# Patient Record
Sex: Female | Born: 1950
Health system: Southern US, Community
[De-identification: ages and names within clinical notes are randomized; demographics above are authoritative.]

## PROBLEM LIST (undated history)

## (undated) DIAGNOSIS — C50919 Malignant neoplasm of unspecified site of unspecified female breast: Secondary | ICD-10-CM

## (undated) DIAGNOSIS — E039 Hypothyroidism, unspecified: Secondary | ICD-10-CM

## (undated) DIAGNOSIS — T8859XA Other complications of anesthesia, initial encounter: Secondary | ICD-10-CM

## (undated) DIAGNOSIS — I7300 Raynaud's syndrome without gangrene: Secondary | ICD-10-CM

## (undated) DIAGNOSIS — L409 Psoriasis, unspecified: Secondary | ICD-10-CM

## (undated) DIAGNOSIS — F419 Anxiety disorder, unspecified: Secondary | ICD-10-CM

## (undated) DIAGNOSIS — R112 Nausea with vomiting, unspecified: Secondary | ICD-10-CM

## (undated) DIAGNOSIS — Z923 Personal history of irradiation: Secondary | ICD-10-CM

## (undated) DIAGNOSIS — Z9889 Other specified postprocedural states: Secondary | ICD-10-CM

## (undated) DIAGNOSIS — M1990 Unspecified osteoarthritis, unspecified site: Secondary | ICD-10-CM

## (undated) DIAGNOSIS — N882 Stricture and stenosis of cervix uteri: Secondary | ICD-10-CM

## (undated) DIAGNOSIS — K219 Gastro-esophageal reflux disease without esophagitis: Secondary | ICD-10-CM

## (undated) DIAGNOSIS — R9389 Abnormal findings on diagnostic imaging of other specified body structures: Secondary | ICD-10-CM

## (undated) HISTORY — DX: Raynaud's syndrome without gangrene: I73.00

## (undated) HISTORY — PX: SHOULDER ACROMIOPLASTY: SHX6093

## (undated) HISTORY — DX: Gastro-esophageal reflux disease without esophagitis: K21.9

## (undated) HISTORY — DX: Malignant neoplasm of unspecified site of unspecified female breast: C50.919

## (undated) HISTORY — DX: Anxiety disorder, unspecified: F41.9

## (undated) HISTORY — PX: SHOULDER ARTHROSCOPY: SHX128

## (undated) HISTORY — PX: TONSILLECTOMY: SUR1361

## (undated) HISTORY — PX: CATARACT EXTRACTION: SUR2

## (undated) HISTORY — DX: Unspecified osteoarthritis, unspecified site: M19.90

## (undated) HISTORY — DX: Psoriasis, unspecified: L40.9

---

## 1998-04-25 DIAGNOSIS — C4490 Unspecified malignant neoplasm of skin, unspecified: Secondary | ICD-10-CM

## 1998-04-25 DIAGNOSIS — Z85828 Personal history of other malignant neoplasm of skin: Secondary | ICD-10-CM

## 1998-04-25 HISTORY — DX: Personal history of other malignant neoplasm of skin: Z85.828

## 1998-04-25 HISTORY — DX: Unspecified malignant neoplasm of skin, unspecified: C44.90

## 2001-07-04 ENCOUNTER — Other Ambulatory Visit: Admission: RE | Admit: 2001-07-04 | Discharge: 2001-07-04 | Payer: Self-pay | Admitting: Family Medicine

## 2002-09-10 ENCOUNTER — Other Ambulatory Visit: Admission: RE | Admit: 2002-09-10 | Discharge: 2002-09-10 | Payer: Self-pay | Admitting: Family Medicine

## 2003-06-05 ENCOUNTER — Encounter: Admission: RE | Admit: 2003-06-05 | Discharge: 2003-06-05 | Payer: Self-pay | Admitting: Family Medicine

## 2005-10-31 ENCOUNTER — Encounter: Admission: RE | Admit: 2005-10-31 | Discharge: 2005-10-31 | Payer: Self-pay | Admitting: Family Medicine

## 2005-12-24 HISTORY — PX: COLONOSCOPY: SHX174

## 2005-12-30 LAB — HM COLONOSCOPY

## 2007-09-11 ENCOUNTER — Encounter: Admission: RE | Admit: 2007-09-11 | Discharge: 2007-09-11 | Payer: Self-pay | Admitting: Family Medicine

## 2012-03-21 ENCOUNTER — Other Ambulatory Visit: Payer: Self-pay | Admitting: Family Medicine

## 2012-03-21 DIAGNOSIS — Z1231 Encounter for screening mammogram for malignant neoplasm of breast: Secondary | ICD-10-CM

## 2012-05-04 ENCOUNTER — Ambulatory Visit
Admission: RE | Admit: 2012-05-04 | Discharge: 2012-05-04 | Disposition: A | Discharge status: Home / Self Care | Payer: BC Managed Care – PPO | Source: Ambulatory Visit | Attending: Family Medicine | Admitting: Family Medicine

## 2012-05-04 DIAGNOSIS — Z1231 Encounter for screening mammogram for malignant neoplasm of breast: Secondary | ICD-10-CM

## 2014-01-29 ENCOUNTER — Other Ambulatory Visit: Payer: Self-pay

## 2014-01-29 DIAGNOSIS — Z1239 Encounter for other screening for malignant neoplasm of breast: Secondary | ICD-10-CM

## 2014-02-24 ENCOUNTER — Other Ambulatory Visit: Payer: Self-pay

## 2014-02-24 ENCOUNTER — Ambulatory Visit
Admission: RE | Admit: 2014-02-24 | Discharge: 2014-02-24 | Disposition: A | Discharge status: Home / Self Care | Payer: BC Managed Care – PPO | Source: Ambulatory Visit

## 2014-02-24 ENCOUNTER — Encounter (INDEPENDENT_AMBULATORY_CARE_PROVIDER_SITE_OTHER): Payer: Self-pay

## 2014-02-24 DIAGNOSIS — Z1231 Encounter for screening mammogram for malignant neoplasm of breast: Secondary | ICD-10-CM

## 2014-02-25 ENCOUNTER — Other Ambulatory Visit: Payer: Self-pay | Admitting: Family Medicine

## 2014-02-25 DIAGNOSIS — R1011 Right upper quadrant pain: Secondary | ICD-10-CM

## 2014-03-03 ENCOUNTER — Ambulatory Visit
Admission: RE | Admit: 2014-03-03 | Discharge: 2014-03-03 | Disposition: A | Discharge status: Home / Self Care | Payer: BC Managed Care – PPO | Source: Ambulatory Visit | Attending: Family Medicine | Admitting: Family Medicine

## 2014-03-03 ENCOUNTER — Other Ambulatory Visit: Payer: Self-pay | Admitting: Family Medicine

## 2014-03-03 DIAGNOSIS — R1011 Right upper quadrant pain: Secondary | ICD-10-CM

## 2016-01-07 LAB — HM COLONOSCOPY

## 2017-04-25 HISTORY — PX: CATARACT EXTRACTION W/ INTRAOCULAR LENS IMPLANT: SHX1309

## 2018-02-02 ENCOUNTER — Other Ambulatory Visit: Payer: Self-pay | Admitting: Nurse Practitioner

## 2018-02-02 DIAGNOSIS — Z1231 Encounter for screening mammogram for malignant neoplasm of breast: Secondary | ICD-10-CM

## 2018-02-23 HISTORY — PX: EYE SURGERY: SHX253

## 2018-03-27 ENCOUNTER — Ambulatory Visit
Admission: RE | Admit: 2018-03-27 | Discharge: 2018-03-27 | Disposition: A | Discharge status: Home / Self Care | Payer: Medicare Other | Source: Ambulatory Visit | Attending: Nurse Practitioner | Admitting: Nurse Practitioner

## 2018-03-27 DIAGNOSIS — Z1231 Encounter for screening mammogram for malignant neoplasm of breast: Secondary | ICD-10-CM

## 2019-10-21 DIAGNOSIS — Z1331 Encounter for screening for depression: Secondary | ICD-10-CM | POA: Diagnosis not present

## 2019-10-21 DIAGNOSIS — Z0000 Encounter for general adult medical examination without abnormal findings: Secondary | ICD-10-CM | POA: Diagnosis not present

## 2019-10-21 DIAGNOSIS — Z9181 History of falling: Secondary | ICD-10-CM | POA: Diagnosis not present

## 2020-02-24 DIAGNOSIS — L409 Psoriasis, unspecified: Secondary | ICD-10-CM | POA: Diagnosis not present

## 2020-02-24 DIAGNOSIS — Z6822 Body mass index (BMI) 22.0-22.9, adult: Secondary | ICD-10-CM | POA: Diagnosis not present

## 2020-02-24 DIAGNOSIS — R300 Dysuria: Secondary | ICD-10-CM | POA: Diagnosis not present

## 2020-02-24 DIAGNOSIS — N390 Urinary tract infection, site not specified: Secondary | ICD-10-CM | POA: Diagnosis not present

## 2020-03-11 ENCOUNTER — Ambulatory Visit: Payer: Medicare PPO | Admitting: Family Medicine

## 2020-03-11 ENCOUNTER — Other Ambulatory Visit: Payer: Self-pay

## 2020-03-11 ENCOUNTER — Encounter: Payer: Self-pay | Admitting: Family Medicine

## 2020-03-11 VITALS — BP 120/69 | HR 73 | Ht 64.0 in | Wt 130.4 lb

## 2020-03-11 DIAGNOSIS — M79671 Pain in right foot: Secondary | ICD-10-CM | POA: Diagnosis not present

## 2020-03-11 DIAGNOSIS — M79672 Pain in left foot: Secondary | ICD-10-CM

## 2020-03-11 DIAGNOSIS — E785 Hyperlipidemia, unspecified: Secondary | ICD-10-CM | POA: Diagnosis not present

## 2020-03-11 DIAGNOSIS — Z0000 Encounter for general adult medical examination without abnormal findings: Secondary | ICD-10-CM | POA: Diagnosis not present

## 2020-03-11 NOTE — Progress Notes (Addendum)
Office Visit Note   Patient: Sheena White           Date of Birth: 03/31/1951           MRN: [id number] Visit Date: 03/11/2020 Requested by: Philmore Pali, NP [place],  [place] [address] PCP: Philmore Pali, NP  Subjective: Chief Complaint  Patient presents with  . establish primary care    HPI: She is here to establish care.  She is due for a wellness exam with labs.  Her daughter is a patient of our clinic.  This past March she received the Covid vaccine.  Since then she has not felt her normal self.  She has a history of psoriasis which resolved completely in 2014 when she eliminated gluten from her diet.  She was completely asymptomatic until this past year and now her rash is almost as bad as it was before.  Her previous provider gave her a refill of topical steroids and that seems to be helping.  In addition, recently her feet have bothered her without injury.  The left one started hurting first, then the right.  Both have been hurting near the MTP joints of the second and third toes.  She is still ambulatory.  She has a history of hyperlipidemia in the past not requiring treatment.  She has a history of GERD which is currently under good control.                ROS:   All other systems were reviewed and are negative.  Objective: Vital Signs: BP 120/69   Pulse 73   Ht 5\' 4"  (1.626 m)   Wt 130 lb 6.4 oz (59.1 kg)   BMI 22.38 kg/m   Physical Exam:  General:  Alert and oriented, in no acute distress. Pulm:  Breathing unlabored. Psy:  Normal mood, congruent affect. Skin: She has psoriasis on the back of her scalp. HEENT:  Nasal passages are clear.  No significant lymphadenopathy.  No thyromegaly or nodules.  2+ carotid pulses without bruits. CV: Regular rate and rhythm without murmurs, rubs, or gallops.  No peripheral edema.  2+ radial and posterior tibial pulses. Lungs: Clear to auscultation throughout with no wheezing or areas of consolidation. Abd: Bowel  sounds are active, no hepatosplenomegaly or masses.  Soft and nontender.  No audible bruits.  No evidence of ascites. Feet: There is slight swelling on the dorsum of both feet near the second and third MTP joints.  She has tenderness to palpation dorsally but not from the plantar approach.  No pain with extension of the toes against resistance.   Imaging: No results found.  Assessment & Plan: 1.  Wellness exam -Labs to evaluate.  2.  Psoriasis -Trial of elimination diet.  3.  Bilateral foot pain, possible metatarsalgia versus metatarsal stress fractures. -Trial of Voltaren gel.  X-rays if symptoms persist.     Procedures: No procedures performed  No notes on file     PMFS History: There are no problems to display for this patient.  History reviewed. No pertinent past medical history.  History reviewed. No pertinent family history.  History reviewed. No pertinent surgical history. Social History   Occupational History  . Not on file  Tobacco Use  . Smoking status: Not on file  Substance and Sexual Activity  . Alcohol use: Not on file  . Drug use: Not on file  . Sexual activity: Not on file

## 2020-03-12 ENCOUNTER — Telehealth: Payer: Self-pay | Admitting: Family Medicine

## 2020-03-12 DIAGNOSIS — E785 Hyperlipidemia, unspecified: Secondary | ICD-10-CM

## 2020-03-12 LAB — COMPREHENSIVE METABOLIC PANEL
AG Ratio: 2 (calc) (ref 1.0–2.5)
ALT: 20 U/L (ref 6–29)
AST: 25 U/L (ref 10–35)
Albumin: 4.7 g/dL (ref 3.6–5.1)
Alkaline phosphatase (APISO): 71 U/L (ref 37–153)
BUN: 13 mg/dL (ref 7–25)
CO2: 26 mmol/L (ref 20–32)
Calcium: 10.1 mg/dL (ref 8.6–10.4)
Chloride: 98 mmol/L (ref 98–110)
Creat: 0.68 mg/dL (ref 0.50–0.99)
Globulin: 2.4 g/dL (calc) (ref 1.9–3.7)
Glucose, Bld: 90 mg/dL (ref 65–99)
Potassium: 4 mmol/L (ref 3.5–5.3)
Sodium: 137 mmol/L (ref 135–146)
Total Bilirubin: 0.4 mg/dL (ref 0.2–1.2)
Total Protein: 7.1 g/dL (ref 6.1–8.1)

## 2020-03-12 LAB — CBC WITH DIFFERENTIAL/PLATELET
Absolute Monocytes: 518 cells/uL (ref 200–950)
Basophils Absolute: 73 cells/uL (ref 0–200)
Basophils Relative: 0.9 %
Eosinophils Absolute: 113 cells/uL (ref 15–500)
Eosinophils Relative: 1.4 %
HCT: 40.8 % (ref 35.0–45.0)
Hemoglobin: 13.3 g/dL (ref 11.7–15.5)
Lymphs Abs: 3272 cells/uL (ref 850–3900)
MCH: 30.8 pg (ref 27.0–33.0)
MCHC: 32.6 g/dL (ref 32.0–36.0)
MCV: 94.4 fL (ref 80.0–100.0)
MPV: 10.1 fL (ref 7.5–12.5)
Monocytes Relative: 6.4 %
Neutro Abs: 4123 cells/uL (ref 1500–7800)
Neutrophils Relative %: 50.9 %
Platelets: 328 10*3/uL (ref 140–400)
RBC: 4.32 10*6/uL (ref 3.80–5.10)
RDW: 13.1 % (ref 11.0–15.0)
Total Lymphocyte: 40.4 %
WBC: 8.1 10*3/uL (ref 3.8–10.8)

## 2020-03-12 LAB — THYROID PANEL WITH TSH
Free Thyroxine Index: 2.5 (ref 1.4–3.8)
T3 Uptake: 28 % (ref 22–35)
T4, Total: 9.1 ug/dL (ref 5.1–11.9)
TSH: 3.4 mIU/L (ref 0.40–4.50)

## 2020-03-12 LAB — LIPID PANEL
Cholesterol: 258 mg/dL — ABNORMAL HIGH (ref <200)
HDL: 82 mg/dL (ref >=50)
LDL Cholesterol (Calc): 160 mg/dL (calc) — ABNORMAL HIGH
Non-HDL Cholesterol (Calc): 176 mg/dL (calc) — ABNORMAL HIGH (ref <130)
Total CHOL/HDL Ratio: 3.1 (calc) (ref <5.0)
Triglycerides: 68 mg/dL (ref <150)

## 2020-03-12 LAB — HIGH SENSITIVITY CRP: hs-CRP: 1.5 mg/L

## 2020-03-12 NOTE — Telephone Encounter (Signed)
Labs are notable for the following:  Thyroid studies are in normal range but TSH is lightly higher than ideal at 3.4.  A better range would be 0.5-1.0.  Total and LDL cholesterol are elevated, but HDL and triglycerides look great.  Regular exercise and dietary limitation of processed carbohydrates and sweets remains most important for cardiac prevention.  Could contemplate ordering a CT calcium score to better assess cardiac risk.  Would recheck in 6 to 12 months.  All else looks good.

## 2020-03-13 ENCOUNTER — Encounter: Payer: Self-pay | Admitting: Family Medicine

## 2020-03-13 DIAGNOSIS — M79671 Pain in right foot: Secondary | ICD-10-CM

## 2020-03-17 ENCOUNTER — Ambulatory Visit (INDEPENDENT_AMBULATORY_CARE_PROVIDER_SITE_OTHER): Payer: Medicare PPO

## 2020-03-17 ENCOUNTER — Telehealth: Payer: Self-pay

## 2020-03-17 ENCOUNTER — Telehealth: Payer: Self-pay | Admitting: Family Medicine

## 2020-03-17 ENCOUNTER — Other Ambulatory Visit: Payer: Self-pay

## 2020-03-17 ENCOUNTER — Ambulatory Visit: Payer: Medicare PPO

## 2020-03-17 DIAGNOSIS — M79672 Pain in left foot: Secondary | ICD-10-CM

## 2020-03-17 DIAGNOSIS — M79671 Pain in right foot: Secondary | ICD-10-CM

## 2020-03-17 NOTE — Telephone Encounter (Signed)
Three-view x-rays of the left foot reveal osteoarthritis at the first and second MTP joints.  No acute abnormality seen, no sign of stress fracture.  X-rays of the right foot reveal a distal fourth metatarsal stress fracture with slight angulation.  No other abnormality seen.

## 2020-03-17 NOTE — Telephone Encounter (Signed)
The patient came in today for bilateral foot xrays. Please review these and advise the patient on the findings.

## 2020-03-17 NOTE — Telephone Encounter (Signed)
Sent a new patient message to set up a "nurse only" appointment.

## 2020-03-17 NOTE — Addendum Note (Signed)
Addended by: Hortencia Pilar on: 03/17/2020 07:59 AM   Modules accepted: Orders

## 2020-03-17 NOTE — Progress Notes (Signed)
The patient came in today for bilateral foot xrays per Dr. Junius Roads. He will review them and message the patient with results.

## 2020-03-17 NOTE — Telephone Encounter (Signed)
Done. MyChart message sent.

## 2020-03-18 ENCOUNTER — Ambulatory Visit (INDEPENDENT_AMBULATORY_CARE_PROVIDER_SITE_OTHER): Payer: Medicare PPO

## 2020-03-18 DIAGNOSIS — M84374A Stress fracture, right foot, initial encounter for fracture: Secondary | ICD-10-CM

## 2020-03-18 NOTE — Progress Notes (Signed)
Patient came in to be fitted for a post op shoe vs cam boot, for a right foot stress fracture, per Dr. Junius Roads. A small cam boot gave her the most support. Appointment made for office visit with Dr. Junius Roads on 04/08/2020 at 9:20 - repeat foot xrays at that time.

## 2020-03-23 ENCOUNTER — Other Ambulatory Visit: Payer: Self-pay | Admitting: Nurse Practitioner

## 2020-03-23 DIAGNOSIS — Z1231 Encounter for screening mammogram for malignant neoplasm of breast: Secondary | ICD-10-CM

## 2020-03-27 ENCOUNTER — Ambulatory Visit
Admission: RE | Admit: 2020-03-27 | Discharge: 2020-03-27 | Disposition: A | Discharge status: Home / Self Care | Payer: Medicare PPO | Source: Ambulatory Visit

## 2020-03-27 ENCOUNTER — Other Ambulatory Visit: Payer: Self-pay

## 2020-03-27 DIAGNOSIS — Z1231 Encounter for screening mammogram for malignant neoplasm of breast: Secondary | ICD-10-CM

## 2020-03-31 ENCOUNTER — Telehealth: Payer: Self-pay | Admitting: Family Medicine

## 2020-03-31 DIAGNOSIS — N63 Unspecified lump in unspecified breast: Secondary | ICD-10-CM

## 2020-03-31 NOTE — Telephone Encounter (Signed)
Mammogram incomplete.

## 2020-04-01 ENCOUNTER — Other Ambulatory Visit: Payer: Self-pay | Admitting: Nurse Practitioner

## 2020-04-01 DIAGNOSIS — R928 Other abnormal and inconclusive findings on diagnostic imaging of breast: Secondary | ICD-10-CM

## 2020-04-01 DIAGNOSIS — H26493 Other secondary cataract, bilateral: Secondary | ICD-10-CM | POA: Diagnosis not present

## 2020-04-03 ENCOUNTER — Ambulatory Visit
Admission: RE | Admit: 2020-04-03 | Discharge: 2020-04-03 | Disposition: A | Discharge status: Home / Self Care | Payer: Medicare PPO | Source: Ambulatory Visit | Attending: Nurse Practitioner | Admitting: Nurse Practitioner

## 2020-04-03 ENCOUNTER — Other Ambulatory Visit: Payer: Self-pay | Admitting: Nurse Practitioner

## 2020-04-03 ENCOUNTER — Other Ambulatory Visit: Payer: Self-pay

## 2020-04-03 ENCOUNTER — Ambulatory Visit: Payer: Medicare PPO

## 2020-04-03 DIAGNOSIS — R928 Other abnormal and inconclusive findings on diagnostic imaging of breast: Secondary | ICD-10-CM | POA: Diagnosis not present

## 2020-04-03 DIAGNOSIS — N6489 Other specified disorders of breast: Secondary | ICD-10-CM | POA: Diagnosis not present

## 2020-04-06 NOTE — Addendum Note (Signed)
Addended by: Hortencia Pilar on: 04/06/2020 09:56 AM   Modules accepted: Orders

## 2020-04-07 ENCOUNTER — Ambulatory Visit
Admission: RE | Admit: 2020-04-07 | Discharge: 2020-04-07 | Disposition: A | Discharge status: Home / Self Care | Payer: Medicare PPO | Source: Ambulatory Visit | Attending: Nurse Practitioner | Admitting: Nurse Practitioner

## 2020-04-07 ENCOUNTER — Telehealth: Payer: Self-pay | Admitting: Family Medicine

## 2020-04-07 ENCOUNTER — Other Ambulatory Visit: Payer: Self-pay

## 2020-04-07 DIAGNOSIS — N6321 Unspecified lump in the left breast, upper outer quadrant: Secondary | ICD-10-CM | POA: Diagnosis not present

## 2020-04-07 DIAGNOSIS — R928 Other abnormal and inconclusive findings on diagnostic imaging of breast: Secondary | ICD-10-CM

## 2020-04-07 NOTE — Telephone Encounter (Signed)
CT Calcium Score was Zero.

## 2020-04-08 ENCOUNTER — Ambulatory Visit (INDEPENDENT_AMBULATORY_CARE_PROVIDER_SITE_OTHER): Payer: Medicare PPO

## 2020-04-08 ENCOUNTER — Encounter: Payer: Self-pay | Admitting: Family Medicine

## 2020-04-08 ENCOUNTER — Ambulatory Visit (INDEPENDENT_AMBULATORY_CARE_PROVIDER_SITE_OTHER): Payer: Medicare PPO | Admitting: Family Medicine

## 2020-04-08 DIAGNOSIS — S92354D Nondisplaced fracture of fifth metatarsal bone, right foot, subsequent encounter for fracture with routine healing: Secondary | ICD-10-CM

## 2020-04-08 DIAGNOSIS — C50912 Malignant neoplasm of unspecified site of left female breast: Secondary | ICD-10-CM

## 2020-04-08 NOTE — Progress Notes (Signed)
   Office Visit Note   Patient: Sheena White           Date of Birth: 09/08/1950           MRN: [id number] Visit Date: 04/08/2020 Requested by: Philmore Pali, NP [address] [place],  [place] [address] PCP: Eunice Blase, MD  Subjective: Chief Complaint  Patient presents with  . Right Foot - Follow-up    3 weeks f/u of stress fracture. In short fracture boot (FWB) - no pain in this.    HPI: She is here for follow-up 3 weeks status post onset of right foot pain with subsequent diagnosis of fourth metatarsal stress fracture.  She is feeling much better in her fracture boot.  Incidentally, her recent breast nodule biopsy showed cancer, grade 1-2 according to the initial microscopic evaluation.  Final pathology interpretation is pending, and she is scheduled to meet with the multidisciplinary team in the near future.                ROS:   All other systems were reviewed and are negative.  Objective: Vital Signs: There were no vitals taken for this visit.  Physical Exam:  General:  Alert and oriented, in no acute distress. Pulm:  Breathing unlabored. Psy:  Normal mood, congruent affect.  Right foot: Still has mild tenderness at the distal fourth metatarsal.    Imaging: X-rays of the right foot reveal abundant callus formation at the fourth  metatarsal fracture site with no further angulation or displacement.    Assessment & Plan: 1.  Clinically healing right fourth metatarsal fracture -Okay to wean from fracture boot as pain permits.  Follow-up as needed.  2.  New diagnosis of left breast cancer -Proceed with consultation as scheduled.     Procedures: No procedures performed        PMFS History: There are no problems to display for this patient.  History reviewed. No pertinent past medical history.  History reviewed. No pertinent family history.  History reviewed. No pertinent surgical history. Social History   Occupational History  . Not on file   Tobacco Use  . Smoking status: Not on file  . Smokeless tobacco: Not on file  Substance and Sexual Activity  . Alcohol use: Not on file  . Drug use: Not on file  . Sexual activity: Not on file

## 2020-04-09 ENCOUNTER — Telehealth: Payer: Self-pay | Admitting: Oncology

## 2020-04-09 ENCOUNTER — Encounter: Payer: Self-pay | Admitting: *Deleted

## 2020-04-09 NOTE — Telephone Encounter (Signed)
Spoke to patient to confirm morning [facility] appointment for 12/22, packet was e-mailed to patient

## 2020-04-13 ENCOUNTER — Encounter: Payer: Self-pay | Admitting: *Deleted

## 2020-04-13 DIAGNOSIS — Z170 Estrogen receptor positive status [ER+]: Secondary | ICD-10-CM | POA: Insufficient documentation

## 2020-04-13 DIAGNOSIS — C50412 Malignant neoplasm of upper-outer quadrant of left female breast: Secondary | ICD-10-CM

## 2020-04-13 MED ORDER — AMOXICILLIN 500 MG PO TABS: 1000.0000 mg | ORAL_TABLET | Freq: Two times a day (BID) | ORAL | 0 refills | Status: DC

## 2020-04-13 NOTE — Addendum Note (Signed)
Addended by: Hortencia Pilar on: 04/13/2020 12:00 PM   Modules accepted: Orders

## 2020-04-14 NOTE — Progress Notes (Signed)
Sheena White  Telephone:([phone]) [phone] Fax:([phone]) [phone]     ID: Sheena White DOB: May 27, 1950  MR#: [id number]  QTM#:[id number]  Patient Care Team: Eunice Blase, MD as PCP - General (Family Medicine) Mauro Kaufmann, RN as Oncology Nurse Navigator Rockwell Germany, RN as Oncology Nurse Navigator Durwin Davisson, Virgie Dad, MD as Consulting Physician (Oncology) Jovita Kussmaul, MD as Consulting Physician (General Surgery) Kyung Rudd, MD as Consulting Physician (Radiation Oncology) Chauncey Cruel, MD OTHER MD:  CHIEF COMPLAINT: Estrogen receptor positive lobular breast cancer  CURRENT TREATMENT: Awaiting definitive surgery   HISTORY OF CURRENT ILLNESS: Sheena White had routine screening mammography on 03/27/2020 showing a possible abnormality in the bilateral breasts. She underwent bilateral diagnostic mammography with tomography and left breast ultrasonography at [facility] on 04/03/2020 showing: breast density category B; palpable 1.2 cm left breast mass at 3 o'clock; resolution of questioned right breast asymmetry seen on screening mammogram; normal-appearing left axillary lymph nodes.  Accordingly on 04/07/2020 she proceeded to biopsy of the left breast area in question. The pathology from this procedure ([id number]) showed: invasive mammary carcinoma, e-cadherin negative, grade 1/2. Prognostic indicators significant for: estrogen receptor, 80% positive and progesterone receptor, 5% positive, both with strong staining intensity. Proliferation marker Ki67 at 2%. HER2 negative by immunohistochemistry (1+).  The patientWhite subsequent history is as detailed below.   INTERVAL HISTORY: Sheena White was evaluated in the multidisciplinary breast cancer clinic on 04/15/2020 accompanied by husband Sheena White, as well as her daughter Sheena White on speaker phone. Her case was also presented at the multidisciplinary breast cancer conference on the same day. At that time a preliminary plan  was proposed: MRI given the lobular nature, followed by breast conserving surgery; Oncotype; adjuvant radiation, and antiestrogens   REVIEW OF SYSTEMS: There were no specific symptoms leading to the original mammogram, which was routinely scheduled. On the provided questionnaire, Sheena White reports wearing glasses, runny nose and productive cough from a recent head cold, breast pain due to biopsy, psoriasis, recent stress fracture in foot, and mild anxiety. The patient denies unusual headaches, visual changes, nausea, vomiting, stiff neck, dizziness, or gait imbalance. There has been no cough, phlegm production, or pleurisy, no chest pain or pressure, and no change in bowel or bladder habits. The patient denies fever, rash, bleeding, unexplained fatigue or unexplained weight loss. A detailed review of systems was otherwise entirely negative.   COVID 19 VACCINATION STATUS:    PAST MEDICAL HISTORY: Past Medical History:  Diagnosis Date  . Anxiety   . Arthritis    mild  . Breast cancer ([place])   . GERD (gastroesophageal reflux disease)   . Psoriasis   . RaynaudWhite disease   . Skin cancer 2000    PAST SURGICAL HISTORY: Past Surgical History:  Procedure Laterality Date  . CATARACT EXTRACTION    . EYE SURGERY  02/23/2018   cataract  . SHOULDER ACROMIOPLASTY    . TONSILLECTOMY      FAMILY HISTORY: Family History  Problem Relation Age of Onset  . Early death Mother   . Stroke Mother   . Early death Father   . Heart disease Father   . Colon cancer Maternal Aunt   . Alcohol abuse Brother   . Drug abuse Brother   . Varicose Veins Maternal Grandmother    Her father died at age 79 from a heart attack. Her mother died at age 19 from a stroke during childbirth. Sheena White had one brother (deceased, cause unknown) and has two  half-sisters. She reports colon cancer in a maternal aunt at around age 39. There is no family history of breast or ovarian cancer to her knowledge.   GYNECOLOGIC HISTORY:  No  LMP recorded. Patient is postmenopausal. Menarche: 69 years old Age at first live birth: 69 years old [place] P 2 LMP 2002 Contraceptive: used for 9 years from 1971-1980 HRT used for 6-12 months in 2002 "to mitigate migraines during perimenopause"  Hysterectomy? no BSO? 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 Husband Sheena White is Pharmacist, hospital of a vending business.. She lives at home with her husband. Daughter Sheena White, age 34, is a Chief of Staff in [place]. Daughter Sheena White, age 55, is a housewife in [place]. Arisbel has 4 grandchildren. She attends [employer].    ADVANCED DIRECTIVES: in place   HEALTH MAINTENANCE: Social History   Tobacco Use  . Smoking status: Never Smoker  . Smokeless tobacco: Never Used  Substance Use Topics  . Alcohol use: Yes    Alcohol/week: 2.0 standard drinks    Types: 2 Glasses of wine per week  . Drug use: Never     Colonoscopy: 12/2015, recall 2027; Cologard in 2020  PAP: 2018  Bone density: 10/2005, -2.2   Allergies  Allergen Reactions  . Codeine Nausea And Vomiting  . Escitalopram Oxalate Other (See Comments)    "sensitivity"  . Zithromax [Azithromycin] Other (See Comments)    Never wants to take again   . Colesevelam Rash  . Sulfamethoxazole Rash    Current Outpatient Medications  Medication Sig Dispense Refill  . amoxicillin (AMOXIL) 500 MG tablet Take 2 tablets (1,000 mg total) by mouth 2 (two) times daily. 40 tablet 0  . busPIRone (BUSPAR) 15 MG tablet Take 15 mg by mouth 3 (three) times daily.    . calcipotriene-betamethasone (TACLONEX SCALP) external suspension     . clobetasol (TEMOVATE) 0.05 % external solution     . Eflornithine HCl (VANIQA) 13.9 % cream Apply topically 2 (two) times daily with a meal.    . famotidine (PEPCID) 10 MG tablet     . fluticasone (FLONASE) 50 MCG/ACT nasal spray  Place 1 spray into both nostrils daily as needed for allergies or rhinitis.    Marland Kitchen loratadine (CLARITIN) 10 MG tablet Take 10 mg by mouth daily as needed for allergies.    Marland Kitchen tretinoin (RETIN-A) 0.05 % cream Apply topically at bedtime.     No current facility-administered medications for this visit.    OBJECTIVE: White woman who appears younger than stated age 70:   04/15/20 0853  BP: 129/64  Pulse: 75  Resp: 17  Temp: 98.9 F (37.2 C)  SpO2: 100%     Body mass index is 22.4 kg/m.   Wt Readings from Last 3 Encounters:  04/15/20 130 lb 8 oz (59.2 kg)  03/11/20 130 lb 6.4 oz (59.1 kg)      ECOG FS:1 - Symptomatic but completely ambulatory  Ocular: Sclerae unicteric, pupils round and equal Ear-nose-throat: Wearing a mask Lymphatic: No cervical or supraclavicular adenopathy Lungs no rales or rhonchi Heart regular rate and rhythm Abd soft, nontender, positive bowel sounds MSK no focal spinal tenderness, no joint edema Neuro: non-focal, well-oriented, appropriate affect Breasts: The right breast is unremarkable.  The left breast is status post recent biopsy.  There is a minimal ecchymosis.  There is no palpable mass.  There are  no skin or nipple changes of concern.  Both axillae are benign   LAB RESULTS:  CMP     Component Value Date/Time   NA 137 04/15/2020 0823   K 3.3 (L) 04/15/2020 0823   CL 100 04/15/2020 0823   CO2 28 04/15/2020 0823   GLUCOSE 80 04/15/2020 0823   BUN 14 04/15/2020 0823   CREATININE 0.92 04/15/2020 0823   CREATININE 0.68 03/11/2020 1400   CALCIUM 9.5 04/15/2020 0823   PROT 7.6 04/15/2020 0823   ALBUMIN 3.9 04/15/2020 0823   AST 28 04/15/2020 0823   ALT 23 04/15/2020 0823   ALKPHOS 80 04/15/2020 0823   BILITOT 0.3 04/15/2020 0823   GFRNONAA >60 04/15/2020 0823    No results found for: TOTALPROTELP, ALBUMINELP, A1GS, A2GS, BETS, BETA2SER, GAMS, MSPIKE, SPEI  Lab Results  Component Value Date   WBC 6.1 04/15/2020   NEUTROABS 3.8  04/15/2020   HGB 12.8 04/15/2020   HCT 38.6 04/15/2020   MCV 91.7 04/15/2020   PLT 236 04/15/2020    No results found for: LABCA2  No components found for: HCWCBJ628  No results for input(s): INR in the last 168 hours.  No results found for: LABCA2  No results found for: BTD176  No results found for: HYW737  No results found for: TGG269  No results found for: CA2729  No components found for: HGQUANT  No results found for: CEA1 / No results found for: CEA1   No results found for: AFPTUMOR  No results found for: CHROMOGRNA  No results found for: KPAFRELGTCHN, LAMBDASER, KAPLAMBRATIO (kappa/lambda light chains)  No results found for: HGBA, HGBA2QUANT, HGBFQUANT, HGBSQUAN (Hemoglobinopathy evaluation)   No results found for: LDH  No results found for: IRON, TIBC, IRONPCTSAT (Iron and TIBC)  No results found for: FERRITIN  Urinalysis No results found for: COLORURINE, APPEARANCEUR, LABSPEC, PHURINE, GLUCOSEU, HGBUR, BILIRUBINUR, KETONESUR, PROTEINUR, UROBILINOGEN, NITRITE, LEUKOCYTESUR   STUDIES: US BREAST LTD UNI LEFT INC AXILLA  Result Date: 04/03/2020 CLINICAL DATA:  69 year old female presenting as a recall from screening for possible left breast mass and possible right breast asymmetry. EXAM: DIGITAL DIAGNOSTIC LEFT MAMMOGRAM WITH TOMO ULTRASOUND LEFT BREAST COMPARISON:  Previous exam(s). ACR Breast Density Category b: There are scattered areas of fibroglandular density. FINDINGS: Mammogram: Right breast: Spot compression tomosynthesis and full field mL tomosynthesis views of the right breast performed for a questioned asymmetry seen only on the cc view in the outer right breast posterior depth. On the additional imaging the tissue in this area disperses without persistent asymmetry, mass or distortion. Left breast: Spot compression and full field mL tomosynthesis views of the left breast performed. There is persistence of an irregular mass with spiculation in the  outer left breast posterior depth measuring approximately 0.9 cm. On physical exam, I feel a discrete mass in the outer left breast. Ultrasound: Targeted ultrasound is performed in the left breast at 3 o'clock 5 cm from the nipple demonstrating an irregular hypoechoic mass measuring 1.2 x 0.8 x 0.8 cm. This corresponds to the mass identified on mammogram. Targeted ultrasound of the left axilla demonstrates normal-appearing lymph nodes. IMPRESSION: 1. Suspicious mass in the left breast at 3 o'clock measuring 1.2 cm. 2. Resolution of the questioned asymmetry seen on screening mammogram in the right breast. RECOMMENDATION: Ultrasound-guided core needle biopsy of the left breast mass at 3 o'clock. I have discussed the findings and recommendations with the patient who agrees to proceed with biopsy. If applicable, a reminder letter will be sent to  the patient regarding the next appointment. BI-RADS CATEGORY  4: Suspicious. Electronically Signed   By: Audie Pinto M.D.   On: 04/03/2020 10:45   MM DIAG BREAST TOMO BILATERAL  Result Date: 04/03/2020 CLINICAL DATA:  70 year old female presenting as a recall from screening for possible left breast mass and possible right breast asymmetry. EXAM: DIGITAL DIAGNOSTIC LEFT MAMMOGRAM WITH TOMO ULTRASOUND LEFT BREAST COMPARISON:  Previous exam(s). ACR Breast Density Category b: There are scattered areas of fibroglandular density. FINDINGS: Mammogram: Right breast: Spot compression tomosynthesis and full field mL tomosynthesis views of the right breast performed for a questioned asymmetry seen only on the cc view in the outer right breast posterior depth. On the additional imaging the tissue in this area disperses without persistent asymmetry, mass or distortion. Left breast: Spot compression and full field mL tomosynthesis views of the left breast performed. There is persistence of an irregular mass with spiculation in the outer left breast posterior depth measuring  approximately 0.9 cm. On physical exam, I feel a discrete mass in the outer left breast. Ultrasound: Targeted ultrasound is performed in the left breast at 3 o'clock 5 cm from the nipple demonstrating an irregular hypoechoic mass measuring 1.2 x 0.8 x 0.8 cm. This corresponds to the mass identified on mammogram. Targeted ultrasound of the left axilla demonstrates normal-appearing lymph nodes. IMPRESSION: 1. Suspicious mass in the left breast at 3 o'clock measuring 1.2 cm. 2. Resolution of the questioned asymmetry seen on screening mammogram in the right breast. RECOMMENDATION: Ultrasound-guided core needle biopsy of the left breast mass at 3 o'clock. I have discussed the findings and recommendations with the patient who agrees to proceed with biopsy. If applicable, a reminder letter will be sent to the patient regarding the next appointment. BI-RADS CATEGORY  4: Suspicious. Electronically Signed   By: Audie Pinto M.D.   On: 04/03/2020 10:45   MM 3D SCREEN BREAST BILATERAL  Result Date: 03/31/2020 CLINICAL DATA:  Screening. EXAM: DIGITAL SCREENING BILATERAL MAMMOGRAM WITH TOMO AND CAD COMPARISON:  Previous exams. ACR Breast Density Category b: There are scattered areas of fibroglandular density. FINDINGS: In the right breast an asymmetry requires further evaluation. In the left breast a mass requires further evaluation. Images were processed with CAD. IMPRESSION: Further evaluation is suggested for possible asymmetry in the right breast. Further evaluation is suggested for possible mass in the left breast. RECOMMENDATION: Diagnostic mammogram and possibly ultrasound of both breasts. (Code:[id number]) The patient will be contacted regarding the findings, and additional imaging will be scheduled. BI-RADS CATEGORY  0: Incomplete. Need additional imaging evaluation and/or prior mammograms for comparison. Electronically Signed   By: Everlean Alstrom M.D.   On: 03/31/2020 10:55   MM CLIP PLACEMENT LEFT  Result  Date: 04/07/2020 CLINICAL DATA:  Patient status post ultrasound-guided biopsy left breast mass. EXAM: DIAGNOSTIC LEFT MAMMOGRAM POST ULTRASOUND BIOPSY COMPARISON:  Previous exam(s). FINDINGS: Mammographic images were obtained following ultrasound guided biopsy of left breast mass 3 o'clock position. The biopsy marking clip is in expected position at the site of biopsy. IMPRESSION: Appropriate positioning of the ribbon shaped biopsy marking clip at the site of biopsy in the left breast 3 o'clock position. Final Assessment: Post Procedure Mammograms for Marker Placement Electronically Signed   By: Lovey Newcomer M.D.   On: 04/07/2020 15:21   [place] LT BREAST BX W LOC DEV 1ST LESION IMG BX SPEC US GUIDE  Addendum Date: 04/09/2020   ADDENDUM REPORT: 04/08/2020 10:58 ADDENDUM: Pathology revealed GRADE I-II INVASIVE MAMMARY CARCINOMA  of the Left breast, 3 o'clock. Immunohistochemistry for E-Cadherin is performed and the tumor is negative consistent with lobular carcinoma. This was found to be concordant by Dr. Lovey Newcomer. Pathology results were discussed with the patient, and Dr. Eunice Blase, by telephone. The patient reported doing well after the biopsy with tenderness at the site. Post biopsy instructions and care were reviewed and questions were answered. The patient was encouraged to call [facility] for any additional concerns. My direct phone number was provided. The patient was referred to [facility] on April 15, 2020. Recommendation for a bilateral breast MRI given the lobular histology. Pathology results reported by Terie Purser, RN on 04/08/2020. Electronically Signed   By: Lovey Newcomer M.D.   On: 04/08/2020 10:58   Result Date: 04/09/2020 CLINICAL DATA:  Patient with indeterminate left breast mass. EXAM: ULTRASOUND GUIDED LEFT BREAST CORE NEEDLE BIOPSY COMPARISON:  Previous exam(s). PROCEDURE: I met  with the patient and we discussed the procedure of ultrasound-guided biopsy, including benefits and alternatives. We discussed the high likelihood of a successful procedure. We discussed the risks of the procedure, including infection, bleeding, tissue injury, clip migration, and inadequate sampling. Informed written consent was given. The usual time-out protocol was performed immediately prior to the procedure. Lesion quadrant: Upper outer quadrant Using sterile technique and 1% Lidocaine as local anesthetic, under direct ultrasound visualization, a 14 gauge spring-loaded device was used to perform biopsy of left breast mass 3 o'clock position using a lateral approach. At the conclusion of the procedure ribbon shaped tissue marker clip was deployed into the biopsy cavity. Follow up 2 view mammogram was performed and dictated separately. IMPRESSION: Ultrasound guided biopsy of left breast mass 3 o'clock position. No apparent complications. Electronically Signed: By: Lovey Newcomer M.D. On: 04/07/2020 15:20   XR Foot Complete Right  Result Date: 04/08/2020 X-rays of the right foot reveal abundant callus formation at the fourth metatarsal fracture site with no further angulation or displacement.    ELIGIBLE FOR AVAILABLE RESEARCH PROTOCOL: No  ASSESSMENT: 69 y.o. [place] woman status post left breast upper outer quadrant biopsy 04/07/2020 for a clinical T1c N0, stage IA invasive lobular carcinoma, grade 1 or 2, estrogen and progesterone receptor positive, HER-2 not amplified, with an MIB-1 of 2%  (1) definitive surgery pending  (2) Oncotype to be obtained from the definitive surgical sample: Chemotherapy not anticipated  (3) adjuvant radiation  (4) antiestrogens  PLAN: I met today with Sheena White to review her new diagnosis. Specifically we discussed the biology of her breast cancer, its diagnosis, staging, treatment  options and prognosis. We first reviewed the fact that cancer is not one disease but  more than 100 different diseases and that it is important to keep them separate-- otherwise when friends and relatives discuss their own cancer experiences with Sheena White confusion can result. Similarly we explained that if breast cancer spreads to the bone or liver, the patient would not have bone cancer or liver cancer, but breast cancer in the bone and breast cancer in the liver: one cancer in three places-- not 3 different cancers which otherwise would have to be treated in 3 different ways.  We discussed the difference between local and systemic therapy. In terms of loco-regional treatment, lumpectomy plus radiation is equivalent to mastectomy as far as survival is concerned. For this reason, and because the cosmetic results are generally superior, we recommend breast conserving surgery.  We then discussed the rationale for systemic therapy. There is some risk that this cancer may have already spread to other parts of her body. Patients frequently ask at this point about bone scans, CAT scans and PET scans to find out if they have occult breast cancer somewhere else. The problem is that in early stage disease we are much more likely to find false positives then true cancers and this would expose the patient to unnecessary procedures as well as unnecessary radiation. Scans cannot answer the question the patient really would like to know, which is whether she has microscopic disease elsewhere in her body. For those reasons we do not recommend them.  Of course we would proceed to aggressive evaluation of any symptoms that might suggest metastatic disease, but that is not the case here.  Next we went over the options for systemic therapy which are anti-estrogens, anti-HER-2 immunotherapy, and chemotherapy. Sheena White does not meet criteria for anti-HER-2 immunotherapy. She is a good candidate for anti-estrogens.  The question of chemotherapy is more complicated. Chemotherapy is most effective in rapidly growing,  aggressive tumors. It is much less effective in low-grade, slow growing cancers, like Sheena White. For that reason we are going to request an Oncotype from the definitive surgical sample, as suggested by NCCN guidelines. That will help [place] make a definitive decision regarding chemotherapy in this case.  Sheena White that she does not do well with medication and gets many side effects from almost any treatment.  She wondered if she needed to have the MRI at all, if she had the MRI whether she needed contrast, and whether she needed radiation.  She is also dreading the possibility of antiestrogens.  All this was addressed and also she understands that lobular breast cancers are very difficult to image by standard means and that MRIs will give [place] a truer picture of the extent of her disease at this point.  She wondered if she had both breast removed whether she would need any systemic treatment and after our discussion she understands that the answer is yes.  We also discussed the difference between association on causation so that she will be able to navigate through the reading that she is planning to do regarding her new diagnosis  Sheena White has a good understanding of the overall plan which is for surgery, most likely no chemotherapy, then radiation and then antiestrogens. She knows the goal of treatment in her case is cure. She will call with any problems that may develop before her next visit here.  Total encounter time 65 minutes.Sheena White C. Devontay Celaya, MD 04/15/2020 1:46 PM Medical Oncology and Hematology [facility] [place], [place] [address] Tel. [phone]    Fax. [phone]   This document serves as a record of services personally performed by Lurline Del, MD. It was created on his behalf by Wilburn Mylar, a trained medical scribe. The creation of this record is based on the scribeWhite personal observations and the providerWhite statements to them.   I, Lurline Del MD, have reviewed the above documentation for accuracy and completeness, and I agree with the above.    *Total Encounter Time as defined by the Centers for Medicare and Medicaid Services includes, in addition to the face-to-face time of a patient visit (documented in the note above) non-face-to-face time: obtaining and reviewing outside history, ordering and reviewing medications, tests or procedures, care coordination (communications with other health care professionals or caregivers) and documentation  in the medical record.

## 2020-04-15 ENCOUNTER — Ambulatory Visit: Payer: Self-pay | Admitting: General Surgery

## 2020-04-15 ENCOUNTER — Encounter: Payer: Self-pay | Admitting: Physical Therapy

## 2020-04-15 ENCOUNTER — Inpatient Hospital Stay: Payer: Medicare PPO

## 2020-04-15 ENCOUNTER — Other Ambulatory Visit: Payer: Self-pay

## 2020-04-15 ENCOUNTER — Ambulatory Visit
Admission: RE | Admit: 2020-04-15 | Discharge: 2020-04-15 | Disposition: A | Discharge status: Home / Self Care | Payer: Medicare PPO | Source: Ambulatory Visit | Attending: Radiation Oncology | Admitting: Radiation Oncology

## 2020-04-15 ENCOUNTER — Ambulatory Visit: Payer: Medicare PPO | Attending: General Surgery | Admitting: Physical Therapy

## 2020-04-15 ENCOUNTER — Encounter: Payer: Self-pay | Admitting: Oncology

## 2020-04-15 ENCOUNTER — Inpatient Hospital Stay: Payer: Medicare PPO | Attending: Oncology | Admitting: Oncology

## 2020-04-15 ENCOUNTER — Encounter: Payer: Self-pay | Admitting: Licensed Clinical Social Worker

## 2020-04-15 VITALS — BP 129/64 | HR 75 | Temp 98.9°F | Resp 17 | Ht 64.0 in | Wt 130.5 lb

## 2020-04-15 DIAGNOSIS — Z170 Estrogen receptor positive status [ER+]: Secondary | ICD-10-CM

## 2020-04-15 DIAGNOSIS — Z85820 Personal history of malignant melanoma of skin: Secondary | ICD-10-CM

## 2020-04-15 DIAGNOSIS — R293 Abnormal posture: Secondary | ICD-10-CM | POA: Diagnosis not present

## 2020-04-15 DIAGNOSIS — C50412 Malignant neoplasm of upper-outer quadrant of left female breast: Secondary | ICD-10-CM | POA: Insufficient documentation

## 2020-04-15 DIAGNOSIS — Z808 Family history of malignant neoplasm of other organs or systems: Secondary | ICD-10-CM | POA: Diagnosis not present

## 2020-04-15 LAB — CBC WITH DIFFERENTIAL (CANCER CENTER ONLY)
Abs Immature Granulocytes: 0.02 10*3/uL (ref 0.00–0.07)
Basophils Absolute: 0 10*3/uL (ref 0.0–0.1)
Basophils Relative: 0 %
Eosinophils Absolute: 0.1 10*3/uL (ref 0.0–0.5)
Eosinophils Relative: 1 %
HCT: 38.6 % (ref 36.0–46.0)
Hemoglobin: 12.8 g/dL (ref 12.0–15.0)
Immature Granulocytes: 0 %
Lymphocytes Relative: 26 %
Lymphs Abs: 1.6 10*3/uL (ref 0.7–4.0)
MCH: 30.4 pg (ref 26.0–34.0)
MCHC: 33.2 g/dL (ref 30.0–36.0)
MCV: 91.7 fL (ref 80.0–100.0)
Monocytes Absolute: 0.7 10*3/uL (ref 0.1–1.0)
Monocytes Relative: 11 %
Neutro Abs: 3.8 10*3/uL (ref 1.7–7.7)
Neutrophils Relative %: 62 %
Platelet Count: 236 10*3/uL (ref 150–400)
RBC: 4.21 MIL/uL (ref 3.87–5.11)
RDW: 14.4 % (ref 11.5–15.5)
WBC Count: 6.1 10*3/uL (ref 4.0–10.5)
nRBC: 0 % (ref 0.0–0.2)

## 2020-04-15 LAB — CMP (CANCER CENTER ONLY)
ALT: 23 U/L (ref 0–44)
AST: 28 U/L (ref 15–41)
Albumin: 3.9 g/dL (ref 3.5–5.0)
Alkaline Phosphatase: 80 U/L (ref 38–126)
Anion gap: 9 (ref 5–15)
BUN: 14 mg/dL (ref 8–23)
CO2: 28 mmol/L (ref 22–32)
Calcium: 9.5 mg/dL (ref 8.9–10.3)
Chloride: 100 mmol/L (ref 98–111)
Creatinine: 0.92 mg/dL (ref 0.44–1.00)
GFR, Estimated: >60 mL/min (ref >60)
Glucose, Bld: 80 mg/dL (ref 70–99)
Potassium: 3.3 mmol/L — ABNORMAL LOW (ref 3.5–5.1)
Sodium: 137 mmol/L (ref 135–145)
Total Bilirubin: 0.3 mg/dL (ref 0.3–1.2)
Total Protein: 7.6 g/dL (ref 6.5–8.1)

## 2020-04-15 LAB — GENETIC SCREENING ORDER

## 2020-04-15 MED ORDER — FLUTICASONE PROPIONATE HFA 110 MCG/ACT IN AERO: INHALATION_SPRAY | RESPIRATORY_TRACT | 6 refills | Status: DC

## 2020-04-15 MED ORDER — ALBUTEROL SULFATE HFA 108 (90 BASE) MCG/ACT IN AERS: 2.0000 | INHALATION_SPRAY | Freq: Four times a day (QID) | RESPIRATORY_TRACT | 2 refills | Status: DC | PRN

## 2020-04-15 NOTE — Patient Instructions (Signed)

## 2020-04-15 NOTE — Progress Notes (Signed)
[facility]  Initial Assessment   Sheena White is a 69 y.o. year old female accompanied by patient and husband, Timmothy Sours. Clinical Social Work was referred by [facility] for assessment of psychosocial needs.   SDOH (Social Determinants of Health) assessments performed: Yes SDOH Interventions   Flowsheet Row Most Recent Value  SDOH Interventions   Food Insecurity Interventions Intervention Not Indicated  Financial Strain Interventions Intervention Not Indicated  Housing Interventions Intervention Not Indicated  Transportation Interventions Intervention Not Indicated      Distress Screen completed: Yes ONCBCN DISTRESS SCREENING 04/15/2020  Screening Type Initial Screening  Distress experienced in past week (1-10) 0      Family/Social Information:   Housing Arrangement: patient lives with husband  Family members/support persons in your life? Family (two adult daughters and their families), Friends and Geophysical data processor concerns: no   Employment: Retired from work at [employer]. Now Working part time for church. Income source: Employment and Retirement Water quality scientist concerns: No o Type of concern: None  Food access concerns: no  Religious or spiritual practice: yes, member of church community  Services Currently in place:  n/a  Coping/ Adjustment to diagnosis:  Patient understands treatment plan and what happens next? yes, feels comfortable with treatment plan discussed today. Feels she had all of her questions answered as did her daughters who were present by phone  Concerns about diagnosis and/or treatment: I'm not especially worried about anything  Patient reported stressors: Adjusting to my illness  Patient enjoys time with family/ friends  Current coping skills/ strengths: Capable of independent living, Scientist, research (life sciences), Religious Affiliation and Supportive family/friends    SUMMARY: Current SDOH Barriers:   No significant SDOH barriers noted  today  Clinical Social Work Clinical Goal(s):   Patient will continue to follow recommendations from medical team  Interventions:  Discussed common feeling and emotions when being diagnosed with cancer, and the importance of support during treatment  Informed patient of the support team roles and support services at [facility]  Provided [place] contact information and encouraged patient to call with any questions or concerns   Follow Up Plan: Patient will contact CSW with any support or resource needs Patient verbalizes understanding of plan: Yes    Christeen Douglas , LCSW

## 2020-04-15 NOTE — Addendum Note (Signed)
Addended by: Hortencia Pilar on: 04/15/2020 02:37 PM   Modules accepted: Orders

## 2020-04-15 NOTE — Therapy (Signed)
[facility] [place], [place], [address] Phone: [phone]   Fax:  [phone]  Physical Therapy Evaluation  Patient Details  Name: Sheena White MRN: [id number] Date of Birth: 04-20-1951 Referring Provider (PT): Dr. Autumn Messing   Encounter Date: 04/15/2020   PT End of Session - 04/15/20 1524    Visit Number 1    Number of Visits 2    Date for PT Re-Evaluation 06/10/20    PT Start Time 1112    PT Stop Time 1152    PT Time Calculation (min) 40 min    Activity Tolerance Patient tolerated treatment well    Behavior During Therapy [facility] for tasks assessed/performed           Past Medical History:  Diagnosis Date  . Anxiety   . Arthritis    mild  . Breast cancer ([place])   . GERD (gastroesophageal reflux disease)   . Psoriasis   . Raynaud's disease   . Skin cancer 2000    Past Surgical History:  Procedure Laterality Date  . CATARACT EXTRACTION    . EYE SURGERY  02/23/2018   cataract  . SHOULDER ACROMIOPLASTY    . TONSILLECTOMY      There were no vitals filed for this visit.    Subjective Assessment - 04/15/20 1514    Subjective Patient reports she is here tod[place] to be seen by her medical team for her newly diagnosed left breast cancer.    Patient is accompained by: Family member    Pertinent History Patient was diagnosed on 03/27/2020 with left grade I-II invasive lobular carcinoma breast cancer. It measures 1.2 cm and is located in the upper outer quadrant. It is ER/PR positive and HER2 negative with a Ki67 of 2%. She had a left shoulder scope in 2000 and has psoriasis.    Patient Stated Goals reduce lymphedema risk and learn post op shoulder ROM HEP    Currently in Pain? No/denies              [facility] PT Assessment - 04/15/20 0001      Assessment   Medical Diagnosis Left breast cancer    Referring Provider (PT) Dr. Autumn Messing    Onset Date/Surgical Date 03/27/20    Hand Dominance Right     Prior Therapy none      Precautions   Precautions Other (comment)    Precaution Comments active cancer      Restrictions   Weight Bearing Restrictions No      Balance Screen   Has the patient fallen in the past 6 months No    Has the patient had a decrease in activity level because of a fear of falling?  No    Is the patient reluctant to leave their home because of a fear of falling?  No      Home Ecologist residence    Living Arrangements Spouse/significant other    Available Help at Discharge Family      Prior Function   Level of Independence Independent    Vocation Part time employment    Vocation Requirements Part time Network engineer at [employer] She has not exercised recently due to a stress fracture in her foot but previously did yoga and went to a gym      Cognition   Overall Cognitive Status Within Functional Limits for tasks assessed      Posture/Postural Control   Posture/Postural  Control Postural limitations    Postural Limitations Rounded Shoulders;Forward head      ROM / Strength   AROM / PROM / Strength AROM;Strength      AROM   Overall AROM Comments Right cervical rotation and bilateral sidebending limited 25%; other ROM is WNL    AROM Assessment Site Shoulder    Right/Left Shoulder Right;Left    Right Shoulder Extension 54 Degrees    Right Shoulder Flexion 145 Degrees    Right Shoulder ABduction 158 Degrees    Right Shoulder Internal Rotation 78 Degrees    Right Shoulder External Rotation 84 Degrees    Left Shoulder Extension 58 Degrees    Left Shoulder Flexion 138 Degrees    Left Shoulder ABduction 153 Degrees    Left Shoulder Internal Rotation 57 Degrees    Left Shoulder External Rotation 78 Degrees      Strength   Overall Strength Within functional limits for tasks performed             LYMPHEDEMA/ONCOLOGY QUESTIONNAIRE - 04/15/20 0001      Type   Cancer Type Left breast cancer      Lymphedema  Assessments   Lymphedema Assessments Upper extremities      Right Upper Extremity Lymphedema   10 cm Proximal to Olecranon Process 24.9 cm    Olecranon Process 22.1 cm    10 cm Proximal to Ulnar Styloid Process 19.6 cm    Just Proximal to Ulnar Styloid Process 13.6 cm    Across Hand at [employer] 18.2 cm    At [place] of 2nd Digit 6.2 cm      Left Upper Extremity Lymphedema   10 cm Proximal to Olecranon Process 25.5 cm    Olecranon Process 22.4 cm    10 cm Proximal to Ulnar Styloid Process 18.5 cm    Just Proximal to Ulnar Styloid Process 13.6 cm    Across Hand at [employer] 18.3 cm    At [place] of 2nd Digit 5.9 cm           L-DEX FLOWSHEETS - 04/15/20 1500      L-DEX LYMPHEDEMA SCREENING   Measurement Type Unilateral    L-DEX MEASUREMENT EXTREMITY Upper Extremity    POSITION  Standing    DOMINANT SIDE Right    At Risk Side Left    BASELINE SCORE (UNILATERAL) -8          The patient was assessed using the L-Dex machine today to produce a lymphedema index baseline score. The patient will be reassessed on a regular basis (typically every 3 months) to obtain new L-Dex scores. If the score is > 6.5 points away from his/her baseline score indicating onset of subclinical lymphedema, it will be recommended to wear a compression garment for 4 weeks, 12 hours per day and then be reassessed. If the score continues to be > 6.5 points from baseline at reassessment, we will initiate lymphedema treatment. Assessing in this manner has a 95% rate of preventing clinically significant lymphedema.       Katina Dung - 04/15/20 0001    Open a tight or new jar Moderate difficulty    Do heavy household chores (wash walls, wash floors) No difficulty    Carry a shopping bag or briefcase No difficulty    Wash your back Moderate difficulty    Use a knife to cut food No difficulty    Recreational activities in which you take some force or impact through your  arm, shoulder, or hand (golf,  hammering, tennis) No difficulty    During the past week, to what extent has your arm, shoulder or hand problem interfered with your normal social activities with family, friends, neighbors, or groups? Not at all    During the past week, to what extent has your arm, shoulder or hand problem limited your work or other regular daily activities Not at all    Arm, shoulder, or hand pain. None    Tingling (pins and needles) in your arm, shoulder, or hand None    Difficulty Sleeping No difficulty    DASH Score 9.09 %            Objective measurements completed on examination: See above findings.        Patient was instructed today in a home exercise program today for post op shoulder range of motion. These included active assist shoulder flexion in sitting, scapular retraction, wall walking with shoulder abduction, and hands behind head external rotation.  She was encouraged to do these twice a day, holding 3 seconds and repeating 5 times when permitted by her physician.           PT Education - 04/15/20 1523    Education Details Lymphedema risk reduction and post op shoulder ROM HEP    Person(s) Educated Patient;Spouse    Methods Demonstration;Explanation;Handout    Comprehension Returned demonstration;Verbalized understanding               PT Long Term Goals - 04/15/20 1528      PT LONG TERM GOAL #1   Title Patient will demonstrate she has regained full shoulder ROM and function post operatively compared to bsaelines.    Time 8    Period Weeks    Target Date 06/10/20           Breast Clinic Goals - 04/15/20 1528      Patient will be able to verbalize understanding of pertinent lymphedema risk reduction practices relevant to her diagnosis specifically related to skin care.   Time 1    Period Days    Status Achieved      Patient will be able to return demonstrate and/or verbalize understanding of the post-op home exercise program related to regaining shoulder  range of motion.   Time 1    Period Days    Status Achieved      Patient will be able to verbalize understanding of the importance of attending the postoperative After Breast Cancer Class for further lymphedema risk reduction education and therapeutic exercise.   Time 1    Period Days    Status Achieved                 Plan - 04/15/20 1524    Clinical Impression Statement Patient was diagnosed on 03/27/2020 with left grade I-II invasive lobular carcinoma breast cancer. It measures 1.2 cm and is located in the upper outer quadrant. It is ER/PR positive and HER2 negative with a Ki67 of 2%. She had a left shoulder scope in 2000 and has psoriasis. Her multidisciplinary medical team met prior to her assessments to determine a recommended treatment plan. She is planning to have a left lumpectomy and sentinel node biopsy followed by Oncotype testing, radiation, and anti-estrogen therapy. She will benefit from a post op PT reassessment to determine needs and from L-Dex screens every 3 months for 2 years to detect subclinical lymphedema.    Stability/Clinical Decision Making Stable/Uncomplicated    Clinical Decision  Making Low    Rehab Potential Excellent    PT Frequency --   Eval and 1 f/u visit   PT Treatment/Interventions ADLs/Self Care Home Management;Therapeutic exercise;Patient/family education    PT Next Visit Plan Will reassess 3-4 weeks post op to determine needs    PT Home Exercise Plan Post op shoulder ROM HEP    Consulted and Agree with Plan of Care Patient;Family member/caregiver    Family Member Consulted Husband           Patient will benefit from skilled therapeutic intervention in order to improve the following deficits and impairments:  Postural dysfunction,Decreased range of motion,Impaired UE functional use,Pain,Decreased knowledge of precautions  Visit Diagnosis: Malignant neoplasm of upper-outer quadrant of left breast in female, estrogen receptor positive ([place]) -  Plan: PT plan of care cert/re-cert  Abnormal posture - Plan: PT plan of care cert/re-cert   Patient will follow up at outpatient cancer rehab 3-4 weeks following surgery.  If the patient requires physical therapy at that time, a specific plan will be dictated and sent to the referring physician for approval. The patient was educated today on appropriate basic range of motion exercises to begin post operatively and the importance of attending the After Breast Cancer class following surgery.  Patient was educated today on lymphedema risk reduction practices as it pertains to recommendations that will benefit the patient immediately following surgery.  She verbalized good understanding.      Problem List Patient Active Problem List   Diagnosis Date Noted  . Malignant neoplasm of upper-outer quadrant of left breast in female, estrogen receptor positive ([place]) 04/13/2020   Annia Friendly, PT 04/15/20 3:31 PM  [place], [place], [address] Phone: [phone]   Fax:  [phone]  Name: Sheena White MRN: [id number] Date of Birth: [birth year]

## 2020-04-15 NOTE — Addendum Note (Signed)
Encounter addended by: Hayden Pedro, PA-C on: 04/15/2020 9:52 AM  Actions taken: Clinical Note Signed

## 2020-04-15 NOTE — Progress Notes (Addendum)
Radiation Oncology         [phone] ________________________________  Name: Sheena White        MRN: [id number]  Date of Service: 04/15/2020 DOB: 01/27/1951  XL:KGMWN, Legrand Como, MD  Jovita Kussmaul, MD     REFERRING PHYSICIAN: Autumn Messing III, MD   DIAGNOSIS: The encounter diagnosis was Malignant neoplasm of upper-outer quadrant of left breast in female, estrogen receptor positive ([place]).   HISTORY OF PRESENT ILLNESS: Sheena White is a 69 y.o. female seen in the multidisciplinary breast clinic for a new diagnosis of left breast cancer. The patient was noted to have a screening detected mass and assymmetry in the left breast. Further diagnostic imaging revealed a 1.2 cm mass at 3:00 was seen and her axilla was negative for adenopathy. A biopsy on 04/07/20 revealed a grade 1-2 invasive lobular carcinoma that was ER/PR positive, HER2 negative with a Ki 67 of 2%. She is seen today to discuss options of treatment for her cancer.  PREVIOUS RADIATION THERAPY: No   PAST MEDICAL HISTORY:  Past Medical History:  Diagnosis Date   Anxiety    Breast cancer ([place])    GERD (gastroesophageal reflux disease)    Psoriasis    Raynaud's disease        PAST SURGICAL HISTORY: Past Surgical History:  Procedure Laterality Date   CATARACT EXTRACTION     SHOULDER ACROMIOPLASTY     TONSILLECTOMY       FAMILY HISTORY:  Family History  Problem Relation Age of Onset   Colon cancer Maternal Aunt      SOCIAL HISTORY:  reports that she has never smoked. She has never used smokeless tobacco. She reports current alcohol use. She reports that she does not use drugs. The patient is married and lives in [place]. She has two adult daughters, Marcene Brawn and Anderson Malta who listen in during our call on speakerphone.    ALLERGIES: Codeine, Escitalopram oxalate, Zithromax [azithromycin], Colesevelam, and Sulfamethoxazole   MEDICATIONS:  Current Outpatient Medications  Medication Sig Dispense  Refill   amoxicillin (AMOXIL) 500 MG tablet Take 2 tablets (1,000 mg total) by mouth 2 (two) times daily. 40 tablet 0   busPIRone (BUSPAR) 15 MG tablet Take 15 mg by mouth 3 (three) times daily.     calcipotriene-betamethasone (TACLONEX SCALP) external suspension      clobetasol (TEMOVATE) 0.05 % external solution      Eflornithine HCl (VANIQA) 13.9 % cream Apply topically 2 (two) times daily with a meal.     famotidine (PEPCID) 10 MG tablet      fluticasone (FLONASE) 50 MCG/ACT nasal spray Place 1 spray into both nostrils daily as needed for allergies or rhinitis.     loratadine (CLARITIN) 10 MG tablet Take 10 mg by mouth daily as needed for allergies.     tretinoin (RETIN-A) 0.05 % cream Apply topically at bedtime.     No current facility-administered medications for this encounter.     REVIEW OF SYSTEMS: On review of systems, the patient reports that she is doing well overall. She has had a productive cough and sinus drainage for a few weeks but has tested negative for covid. She reports some aches in her foot following a stress fracture. She has had some discomfort from the breast biopsy, and controlled depression. She denies any chest pain, shortness of breath,, fevers, chills, night sweats, unintended weight changes. She denies any bowel or bladder disturbances, and denies abdominal pain, nausea or vomiting. She denies any new  musculoskeletal or joint aches or pains. A complete review of systems is obtained and is otherwise negative.     PHYSICAL EXAM:  Wt Readings from Last 3 Encounters:  04/15/20 130 lb 8 oz (59.2 kg)  03/11/20 130 lb 6.4 oz (59.1 kg)   Temp Readings from Last 3 Encounters:  04/15/20 98.9 F (37.2 C) (Tympanic)   BP Readings from Last 3 Encounters:  04/15/20 129/64  03/11/20 120/69   Pulse Readings from Last 3 Encounters:  04/15/20 75  03/11/20 73    In general this is a well appearing caucasian female in no acute distress. She's alert and  oriented x4 and appropriate throughout the examination. Cardiopulmonary assessment is negative for acute distress and she exhibits normal effort. Bilateral breast exam is deferred.    ECOG = 1  0 - Asymptomatic (Fully active, able to carry on all predisease activities without restriction)  1 - Symptomatic but completely ambulatory (Restricted in physically strenuous activity but ambulatory and able to carry out work of a light or sedentary nature. For example, light housework, office work)  2 - Symptomatic, <50% in bed during the day (Ambulatory and capable of all self care but unable to carry out any work activities. Up and about more than 50% of waking hours)  3 - Symptomatic, >50% in bed, but not bedbound (Capable of only limited self-care, confined to bed or chair 50% or more of waking hours)  4 - Bedbound (Completely disabled. Cannot carry on any self-care. Totally confined to bed or chair)  5 - Death   Eustace Pen MM, Creech RH, Tormey DC, et al. [phone]). "Toxicity and response criteria of the [facility]". [place] Oncol. 5 (6): 649-55    LABORATORY DATA:  Lab Results  Component Value Date   WBC 6.1 04/15/2020   HGB 12.8 04/15/2020   HCT 38.6 04/15/2020   MCV 91.7 04/15/2020   PLT 236 04/15/2020   Lab Results  Component Value Date   NA 137 04/15/2020   K 3.3 (L) 04/15/2020   CL 100 04/15/2020   CO2 28 04/15/2020   Lab Results  Component Value Date   ALT 23 04/15/2020   AST 28 04/15/2020   ALKPHOS 80 04/15/2020   BILITOT 0.3 04/15/2020      RADIOGRAPHY: US BREAST LTD UNI LEFT INC AXILLA  Result Date: 04/03/2020 CLINICAL DATA:  69 year old female presenting as a recall from screening for possible left breast mass and possible right breast asymmetry. EXAM: DIGITAL DIAGNOSTIC LEFT MAMMOGRAM WITH TOMO ULTRASOUND LEFT BREAST COMPARISON:  Previous exam(s). ACR Breast Density Category b: There are scattered areas of fibroglandular density. FINDINGS:  Mammogram: Right breast: Spot compression tomosynthesis and full field mL tomosynthesis views of the right breast performed for a questioned asymmetry seen only on the cc view in the outer right breast posterior depth. On the additional imaging the tissue in this area disperses without persistent asymmetry, mass or distortion. Left breast: Spot compression and full field mL tomosynthesis views of the left breast performed. There is persistence of an irregular mass with spiculation in the outer left breast posterior depth measuring approximately 0.9 cm. On physical exam, I feel a discrete mass in the outer left breast. Ultrasound: Targeted ultrasound is performed in the left breast at 3 o'clock 5 cm from the nipple demonstrating an irregular hypoechoic mass measuring 1.2 x 0.8 x 0.8 cm. This corresponds to the mass identified on mammogram. Targeted ultrasound of the left axilla demonstrates normal-appearing lymph nodes.  IMPRESSION: 1. Suspicious mass in the left breast at 3 o'clock measuring 1.2 cm. 2. Resolution of the questioned asymmetry seen on screening mammogram in the right breast. RECOMMENDATION: Ultrasound-guided core needle biopsy of the left breast mass at 3 o'clock. I have discussed the findings and recommendations with the patient who agrees to proceed with biopsy. If applicable, a reminder letter will be sent to the patient regarding the next appointment. BI-RADS CATEGORY  4: Suspicious. Electronically Signed   By: Audie Pinto M.D.   On: 04/03/2020 10:45   MM DIAG BREAST TOMO BILATERAL  Result Date: 04/03/2020 CLINICAL DATA:  69 year old female presenting as a recall from screening for possible left breast mass and possible right breast asymmetry. EXAM: DIGITAL DIAGNOSTIC LEFT MAMMOGRAM WITH TOMO ULTRASOUND LEFT BREAST COMPARISON:  Previous exam(s). ACR Breast Density Category b: There are scattered areas of fibroglandular density. FINDINGS: Mammogram: Right breast: Spot compression  tomosynthesis and full field mL tomosynthesis views of the right breast performed for a questioned asymmetry seen only on the cc view in the outer right breast posterior depth. On the additional imaging the tissue in this area disperses without persistent asymmetry, mass or distortion. Left breast: Spot compression and full field mL tomosynthesis views of the left breast performed. There is persistence of an irregular mass with spiculation in the outer left breast posterior depth measuring approximately 0.9 cm. On physical exam, I feel a discrete mass in the outer left breast. Ultrasound: Targeted ultrasound is performed in the left breast at 3 o'clock 5 cm from the nipple demonstrating an irregular hypoechoic mass measuring 1.2 x 0.8 x 0.8 cm. This corresponds to the mass identified on mammogram. Targeted ultrasound of the left axilla demonstrates normal-appearing lymph nodes. IMPRESSION: 1. Suspicious mass in the left breast at 3 o'clock measuring 1.2 cm. 2. Resolution of the questioned asymmetry seen on screening mammogram in the right breast. RECOMMENDATION: Ultrasound-guided core needle biopsy of the left breast mass at 3 o'clock. I have discussed the findings and recommendations with the patient who agrees to proceed with biopsy. If applicable, a reminder letter will be sent to the patient regarding the next appointment. BI-RADS CATEGORY  4: Suspicious. Electronically Signed   By: Audie Pinto M.D.   On: 04/03/2020 10:45   MM 3D SCREEN BREAST BILATERAL  Result Date: 03/31/2020 CLINICAL DATA:  Screening. EXAM: DIGITAL SCREENING BILATERAL MAMMOGRAM WITH TOMO AND CAD COMPARISON:  Previous exams. ACR Breast Density Category b: There are scattered areas of fibroglandular density. FINDINGS: In the right breast an asymmetry requires further evaluation. In the left breast a mass requires further evaluation. Images were processed with CAD. IMPRESSION: Further evaluation is suggested for possible asymmetry in  the right breast. Further evaluation is suggested for possible mass in the left breast. RECOMMENDATION: Diagnostic mammogram and possibly ultrasound of both breasts. (Code:[id number]-33M) The patient will be contacted regarding the findings, and additional imaging will be scheduled. BI-RADS CATEGORY  0: Incomplete. Need additional imaging evaluation and/or prior mammograms for comparison. Electronically Signed   By: Everlean Alstrom M.D.   On: 03/31/2020 10:55   MM CLIP PLACEMENT LEFT  Result Date: 04/07/2020 CLINICAL DATA:  Patient status post ultrasound-guided biopsy left breast mass. EXAM: DIAGNOSTIC LEFT MAMMOGRAM POST ULTRASOUND BIOPSY COMPARISON:  Previous exam(s). FINDINGS: Mammographic images were obtained following ultrasound guided biopsy of left breast mass 3 o'clock position. The biopsy marking clip is in expected position at the site of biopsy. IMPRESSION: Appropriate positioning of the ribbon shaped biopsy marking clip at the  site of biopsy in the left breast 3 o'clock position. Final Assessment: Post Procedure Mammograms for Marker Placement Electronically Signed   By: Lovey Newcomer M.D.   On: 04/07/2020 15:21   [place] LT BREAST BX W LOC DEV 1ST LESION IMG BX SPEC US GUIDE  Addendum Date: 04/09/2020   ADDENDUM REPORT: 04/08/2020 10:58 ADDENDUM: Pathology revealed GRADE I-II INVASIVE MAMMARY CARCINOMA of the Left breast, 3 o'clock. Immunohistochemistry for E-Cadherin is performed and the tumor is negative consistent with lobular carcinoma. This was found to be concordant by Dr. Lovey Newcomer. Pathology results were discussed with the patient, and Dr. Eunice Blase, by telephone. The patient reported doing well after the biopsy with tenderness at the site. Post biopsy instructions and care were reviewed and questions were answered. The patient was encouraged to call [facility] for any additional concerns. My direct phone number was provided. The patient was referred to [facility] on April 15, 2020. Recommendation for a bilateral breast MRI given the lobular histology. Pathology results reported by Terie Purser, RN on 04/08/2020. Electronically Signed   By: Lovey Newcomer M.D.   On: 04/08/2020 10:58   Result Date: 04/09/2020 CLINICAL DATA:  Patient with indeterminate left breast mass. EXAM: ULTRASOUND GUIDED LEFT BREAST CORE NEEDLE BIOPSY COMPARISON:  Previous exam(s). PROCEDURE: I met with the patient and we discussed the procedure of ultrasound-guided biopsy, including benefits and alternatives. We discussed the high likelihood of a successful procedure. We discussed the risks of the procedure, including infection, bleeding, tissue injury, clip migration, and inadequate sampling. Informed written consent was given. The usual time-out protocol was performed immediately prior to the procedure. Lesion quadrant: Upper outer quadrant Using sterile technique and 1% Lidocaine as local anesthetic, under direct ultrasound visualization, a 14 gauge spring-loaded device was used to perform biopsy of left breast mass 3 o'clock position using a lateral approach. At the conclusion of the procedure ribbon shaped tissue marker clip was deployed into the biopsy cavity. Follow up 2 view mammogram was performed and dictated separately. IMPRESSION: Ultrasound guided biopsy of left breast mass 3 o'clock position. No apparent complications. Electronically Signed: By: Lovey Newcomer M.D. On: 04/07/2020 15:20   XR Foot Complete Right  Result Date: 04/08/2020 X-rays of the right foot reveal abundant callus formation at the fourth metatarsal fracture site with no further angulation or displacement.      IMPRESSION/PLAN: 1. Stage IA, [id number] grade 1-2, ER/PR positive invasive lobular carcinoma of the left breast. Dr. Lisbeth Renshaw discusses the pathology findings and reviews the nature of left breast disease. The consensus from the breast  conference includes and MRI of the breast for extent of disease with breast conservation with lumpectomy with  sentinel node biopsy. She is contemplating options however for mastectomy with possible reconstruction. Depending on the size of the final tumor measurements rendered by pathology, the tumor may be tested for Oncotype Dx score to determine a role for systemic therapy. If she had breast conserving surgery, she would benefit from external radiotherapy to the breast  to reduce risks of local recurrence followed by antiestrogen therapy. We discussed the risks, benefits, short, and long term effects of radiotherapy, as well as the curative intent, and the patient is interested in considering all of her options. She is aware that Mastectomy does not rule out the need for radiation, though it's felt that this risk is low.  Dr. Lisbeth Renshaw discusses the delivery and  logistics of radiotherapy and anticipates a course of 4 or 6 1/2 weeks of radiotherapy with deep inspiration breath hold technique. We will follow up on her surgical decision making and see her back if she has breast conserving surgery.   In a visit lasting 60 minutes, greater than 50% of the time was spent face to face reviewing her case, as well as in preparation of, discussing, and coordinating the patient's care.  The above documentation reflects my direct findings during this shared patient visit. Please see the separate note by Dr. Lisbeth Renshaw on this date for the remainder of the patient's plan of care.    Carola Rhine, PAC

## 2020-04-15 NOTE — Addendum Note (Signed)
Addended by: Hortencia Pilar on: 04/15/2020 03:22 PM   Modules accepted: Orders

## 2020-04-16 ENCOUNTER — Telehealth: Payer: Self-pay | Admitting: Oncology

## 2020-04-16 ENCOUNTER — Telehealth: Payer: Self-pay | Admitting: Radiology

## 2020-04-16 NOTE — Telephone Encounter (Signed)
Ryan with [facility] is asking that the office visit note from 03/18/20 have an addendum made in order for insurance to cover the DME. It needs to be noted whether or not the patient is ambulatory.

## 2020-04-16 NOTE — Telephone Encounter (Signed)
Scheduled follow-up appointment per 12/22 los. Patient is aware.

## 2020-04-16 NOTE — Telephone Encounter (Signed)
Done

## 2020-04-16 NOTE — Telephone Encounter (Signed)
FYI

## 2020-04-20 ENCOUNTER — Inpatient Hospital Stay: Admission: RE | Admit: 2020-04-20 | Payer: Medicare PPO | Source: Ambulatory Visit

## 2020-04-20 ENCOUNTER — Other Ambulatory Visit: Payer: Self-pay | Admitting: General Surgery

## 2020-04-20 DIAGNOSIS — C50412 Malignant neoplasm of upper-outer quadrant of left female breast: Secondary | ICD-10-CM

## 2020-04-22 ENCOUNTER — Telehealth: Payer: Self-pay | Admitting: *Deleted

## 2020-04-22 ENCOUNTER — Encounter: Payer: Self-pay | Admitting: *Deleted

## 2020-04-22 NOTE — Telephone Encounter (Signed)
Spoke with patient to follow up from [facility] and assess navigation needs. Patient denies any questions or concerns at this time.  Encouraged patient to call should anything arise.  Patient verbalized understanding.

## 2020-04-25 DIAGNOSIS — Z170 Estrogen receptor positive status [ER+]: Secondary | ICD-10-CM

## 2020-04-25 DIAGNOSIS — C50412 Malignant neoplasm of upper-outer quadrant of left female breast: Secondary | ICD-10-CM

## 2020-04-25 DIAGNOSIS — C50912 Malignant neoplasm of unspecified site of left female breast: Secondary | ICD-10-CM | POA: Insufficient documentation

## 2020-04-25 HISTORY — DX: Malignant neoplasm of upper-outer quadrant of left female breast: C50.412

## 2020-04-25 HISTORY — DX: Malignant neoplasm of upper-outer quadrant of left female breast: Z17.0

## 2020-04-29 ENCOUNTER — Other Ambulatory Visit: Payer: Self-pay

## 2020-04-29 ENCOUNTER — Ambulatory Visit
Admission: RE | Admit: 2020-04-29 | Discharge: 2020-04-29 | Disposition: A | Discharge status: Home / Self Care | Payer: Medicare PPO | Source: Ambulatory Visit | Attending: Oncology | Admitting: Oncology

## 2020-04-29 DIAGNOSIS — C50912 Malignant neoplasm of unspecified site of left female breast: Secondary | ICD-10-CM | POA: Diagnosis not present

## 2020-04-29 DIAGNOSIS — C50412 Malignant neoplasm of upper-outer quadrant of left female breast: Secondary | ICD-10-CM

## 2020-04-29 DIAGNOSIS — Z170 Estrogen receptor positive status [ER+]: Secondary | ICD-10-CM

## 2020-04-29 MED ORDER — GADOBUTROL 1 MMOL/ML IV SOLN
6.0000 mL | Freq: Once | INTRAVENOUS | Status: AC | PRN
  Administered 2020-04-29: 6 mL via INTRAVENOUS

## 2020-04-30 ENCOUNTER — Encounter: Payer: Self-pay | Admitting: Oncology

## 2020-05-01 ENCOUNTER — Other Ambulatory Visit: Payer: Self-pay | Admitting: General Surgery

## 2020-05-01 ENCOUNTER — Encounter: Payer: Self-pay | Admitting: *Deleted

## 2020-05-01 DIAGNOSIS — R9389 Abnormal findings on diagnostic imaging of other specified body structures: Secondary | ICD-10-CM

## 2020-05-08 ENCOUNTER — Other Ambulatory Visit (HOSPITAL_COMMUNITY): Payer: Self-pay | Admitting: Diagnostic Radiology

## 2020-05-08 ENCOUNTER — Ambulatory Visit
Admission: RE | Admit: 2020-05-08 | Discharge: 2020-05-08 | Disposition: A | Discharge status: Home / Self Care | Payer: Medicare PPO | Source: Ambulatory Visit | Attending: General Surgery | Admitting: General Surgery

## 2020-05-08 ENCOUNTER — Other Ambulatory Visit: Payer: Self-pay

## 2020-05-08 DIAGNOSIS — C50512 Malignant neoplasm of lower-outer quadrant of left female breast: Secondary | ICD-10-CM | POA: Diagnosis not present

## 2020-05-08 DIAGNOSIS — R9389 Abnormal findings on diagnostic imaging of other specified body structures: Secondary | ICD-10-CM

## 2020-05-08 DIAGNOSIS — N6342 Unspecified lump in left breast, subareolar: Secondary | ICD-10-CM | POA: Diagnosis not present

## 2020-05-08 MED ORDER — GADOBUTROL 1 MMOL/ML IV SOLN
6.0000 mL | Freq: Once | INTRAVENOUS | Status: AC | PRN
  Administered 2020-05-08: 6 mL via INTRAVENOUS

## 2020-05-11 ENCOUNTER — Encounter: Payer: Self-pay | Admitting: *Deleted

## 2020-05-12 ENCOUNTER — Other Ambulatory Visit: Payer: Self-pay | Admitting: General Surgery

## 2020-05-12 ENCOUNTER — Ambulatory Visit: Payer: Self-pay | Admitting: General Surgery

## 2020-05-12 DIAGNOSIS — Z170 Estrogen receptor positive status [ER+]: Secondary | ICD-10-CM

## 2020-05-12 DIAGNOSIS — C50412 Malignant neoplasm of upper-outer quadrant of left female breast: Secondary | ICD-10-CM

## 2020-05-18 ENCOUNTER — Other Ambulatory Visit: Payer: Self-pay | Admitting: Oncology

## 2020-05-27 ENCOUNTER — Encounter (HOSPITAL_BASED_OUTPATIENT_CLINIC_OR_DEPARTMENT_OTHER): Payer: Self-pay | Admitting: General Surgery

## 2020-05-27 ENCOUNTER — Other Ambulatory Visit: Payer: Self-pay

## 2020-05-29 NOTE — Progress Notes (Signed)

## 2020-05-30 ENCOUNTER — Other Ambulatory Visit (HOSPITAL_COMMUNITY)
Admission: RE | Admit: 2020-05-30 | Discharge: 2020-05-30 | Disposition: A | Discharge status: Home / Self Care | Payer: Medicare PPO | Source: Ambulatory Visit | Attending: General Surgery | Admitting: General Surgery

## 2020-05-30 DIAGNOSIS — Z20822 Contact with and (suspected) exposure to covid-19: Secondary | ICD-10-CM | POA: Insufficient documentation

## 2020-05-30 DIAGNOSIS — Z01812 Encounter for preprocedural laboratory examination: Secondary | ICD-10-CM | POA: Insufficient documentation

## 2020-05-30 LAB — SARS CORONAVIRUS 2 (TAT 6-24 HRS): SARS Coronavirus 2: NEGATIVE

## 2020-06-03 ENCOUNTER — Ambulatory Visit (HOSPITAL_BASED_OUTPATIENT_CLINIC_OR_DEPARTMENT_OTHER): Payer: Medicare PPO | Admitting: Anesthesiology

## 2020-06-03 ENCOUNTER — Ambulatory Visit
Admission: RE | Admit: 2020-06-03 | Discharge: 2020-06-03 | Disposition: A | Discharge status: Home / Self Care | Payer: Medicare PPO | Source: Ambulatory Visit | Attending: General Surgery | Admitting: General Surgery

## 2020-06-03 ENCOUNTER — Ambulatory Visit (HOSPITAL_BASED_OUTPATIENT_CLINIC_OR_DEPARTMENT_OTHER)
Admission: RE | Admit: 2020-06-03 | Discharge: 2020-06-03 | Disposition: A | Discharge status: Home / Self Care | Payer: Medicare PPO | Attending: General Surgery | Admitting: General Surgery

## 2020-06-03 ENCOUNTER — Encounter (HOSPITAL_COMMUNITY)
Admission: RE | Admit: 2020-06-03 | Discharge: 2020-06-03 | Disposition: A | Discharge status: Home / Self Care | Payer: Medicare PPO | Source: Ambulatory Visit | Attending: General Surgery | Admitting: General Surgery

## 2020-06-03 ENCOUNTER — Encounter (HOSPITAL_BASED_OUTPATIENT_CLINIC_OR_DEPARTMENT_OTHER): Payer: Self-pay | Admitting: General Surgery

## 2020-06-03 ENCOUNTER — Other Ambulatory Visit: Payer: Self-pay

## 2020-06-03 ENCOUNTER — Encounter (HOSPITAL_BASED_OUTPATIENT_CLINIC_OR_DEPARTMENT_OTHER)
Admission: RE | Disposition: A | Discharge status: Home / Self Care | Payer: Self-pay | Source: Home / Self Care | Attending: General Surgery

## 2020-06-03 DIAGNOSIS — C50912 Malignant neoplasm of unspecified site of left female breast: Secondary | ICD-10-CM | POA: Diagnosis not present

## 2020-06-03 DIAGNOSIS — Z170 Estrogen receptor positive status [ER+]: Secondary | ICD-10-CM

## 2020-06-03 DIAGNOSIS — C50412 Malignant neoplasm of upper-outer quadrant of left female breast: Secondary | ICD-10-CM | POA: Insufficient documentation

## 2020-06-03 DIAGNOSIS — Z800 Family history of malignant neoplasm of digestive organs: Secondary | ICD-10-CM | POA: Insufficient documentation

## 2020-06-03 DIAGNOSIS — K219 Gastro-esophageal reflux disease without esophagitis: Secondary | ICD-10-CM | POA: Diagnosis not present

## 2020-06-03 DIAGNOSIS — G8918 Other acute postprocedural pain: Secondary | ICD-10-CM | POA: Diagnosis not present

## 2020-06-03 DIAGNOSIS — C773 Secondary and unspecified malignant neoplasm of axilla and upper limb lymph nodes: Secondary | ICD-10-CM | POA: Diagnosis not present

## 2020-06-03 HISTORY — PX: BREAST LUMPECTOMY WITH RADIOACTIVE SEED AND SENTINEL LYMPH NODE BIOPSY: SHX6550

## 2020-06-03 HISTORY — DX: Other specified postprocedural states: Z98.890

## 2020-06-03 HISTORY — DX: Nausea with vomiting, unspecified: R11.2

## 2020-06-03 HISTORY — DX: Other complications of anesthesia, initial encounter: T88.59XA

## 2020-06-03 SURGERY — BREAST LUMPECTOMY WITH RADIOACTIVE SEED AND SENTINEL LYMPH NODE BIOPSY
Anesthesia: General | Site: Breast | Laterality: Left

## 2020-06-03 MED ORDER — FENTANYL CITRATE (PF) 100 MCG/2ML IJ SOLN
50.0000 ug | Freq: Once | INTRAMUSCULAR | Status: AC
  Administered 2020-06-03: 50 ug via INTRAVENOUS

## 2020-06-03 MED ORDER — FENTANYL CITRATE (PF) 100 MCG/2ML IJ SOLN
INTRAMUSCULAR | Status: AC
  Filled 2020-06-03: qty 2

## 2020-06-03 MED ORDER — CEFAZOLIN SODIUM-DEXTROSE 2-4 GM/100ML-% IV SOLN
INTRAVENOUS | Status: AC
  Filled 2020-06-03: qty 100

## 2020-06-03 MED ORDER — DEXAMETHASONE SODIUM PHOSPHATE 10 MG/ML IJ SOLN
INTRAMUSCULAR | Status: AC
  Filled 2020-06-03: qty 1

## 2020-06-03 MED ORDER — CHLORHEXIDINE GLUCONATE CLOTH 2 % EX PADS: 6.0000 | MEDICATED_PAD | Freq: Once | CUTANEOUS | Status: DC

## 2020-06-03 MED ORDER — ONDANSETRON HCL 4 MG/2ML IJ SOLN
INTRAMUSCULAR | Status: DC | PRN
  Administered 2020-06-03: 4 mg via INTRAVENOUS

## 2020-06-03 MED ORDER — MEPERIDINE HCL 25 MG/ML IJ SOLN: 6.2500 mg | INTRAMUSCULAR | Status: DC | PRN

## 2020-06-03 MED ORDER — LACTATED RINGERS IV SOLN: INTRAVENOUS | Status: DC

## 2020-06-03 MED ORDER — GABAPENTIN 300 MG PO CAPS
300.0000 mg | ORAL_CAPSULE | ORAL | Status: AC
  Administered 2020-06-03: 300 mg via ORAL

## 2020-06-03 MED ORDER — ACETAMINOPHEN 500 MG PO TABS
ORAL_TABLET | ORAL | Status: AC
  Filled 2020-06-03: qty 2

## 2020-06-03 MED ORDER — GABAPENTIN 300 MG PO CAPS
ORAL_CAPSULE | ORAL | Status: AC
  Filled 2020-06-03: qty 1

## 2020-06-03 MED ORDER — ROPIVACAINE HCL 7.5 MG/ML IJ SOLN
INTRAMUSCULAR | Status: DC | PRN
  Administered 2020-06-03: 30 mL via PERINEURAL

## 2020-06-03 MED ORDER — OXYCODONE HCL 5 MG/5ML PO SOLN: 5.0000 mg | Freq: Once | ORAL | Status: DC | PRN

## 2020-06-03 MED ORDER — ACETAMINOPHEN 160 MG/5ML PO SOLN: 325.0000 mg | ORAL | Status: DC | PRN

## 2020-06-03 MED ORDER — BUPIVACAINE-EPINEPHRINE 0.25% -1:200000 IJ SOLN
INTRAMUSCULAR | Status: DC | PRN
  Administered 2020-06-03: 15 mL

## 2020-06-03 MED ORDER — ONDANSETRON HCL 4 MG/2ML IJ SOLN: 4.0000 mg | Freq: Once | INTRAMUSCULAR | Status: DC | PRN

## 2020-06-03 MED ORDER — ONDANSETRON HCL 4 MG/2ML IJ SOLN
INTRAMUSCULAR | Status: AC
  Filled 2020-06-03: qty 4

## 2020-06-03 MED ORDER — EPHEDRINE SULFATE-NACL 50-0.9 MG/10ML-% IV SOSY
PREFILLED_SYRINGE | INTRAVENOUS | Status: DC | PRN
  Administered 2020-06-03: 5 mg via INTRAVENOUS
  Administered 2020-06-03: 10 mg via INTRAVENOUS

## 2020-06-03 MED ORDER — TRAMADOL HCL 50 MG PO TABS: 50.0000 mg | ORAL_TABLET | Freq: Four times a day (QID) | ORAL | 1 refills | Status: DC | PRN

## 2020-06-03 MED ORDER — MIDAZOLAM HCL 2 MG/2ML IJ SOLN
INTRAMUSCULAR | Status: AC
  Filled 2020-06-03: qty 2

## 2020-06-03 MED ORDER — MIDAZOLAM HCL 2 MG/2ML IJ SOLN
2.0000 mg | Freq: Once | INTRAMUSCULAR | Status: AC
  Administered 2020-06-03: 2 mg via INTRAVENOUS

## 2020-06-03 MED ORDER — FENTANYL CITRATE (PF) 100 MCG/2ML IJ SOLN
INTRAMUSCULAR | Status: DC | PRN
  Administered 2020-06-03 (×2): 50 ug via INTRAVENOUS

## 2020-06-03 MED ORDER — DEXAMETHASONE SODIUM PHOSPHATE 10 MG/ML IJ SOLN
INTRAMUSCULAR | Status: DC | PRN
  Administered 2020-06-03: 8 mg via INTRAVENOUS

## 2020-06-03 MED ORDER — FENTANYL CITRATE (PF) 100 MCG/2ML IJ SOLN: 25.0000 ug | INTRAMUSCULAR | Status: DC | PRN

## 2020-06-03 MED ORDER — DEXAMETHASONE SODIUM PHOSPHATE 10 MG/ML IJ SOLN
INTRAMUSCULAR | Status: DC | PRN
  Administered 2020-06-03: 10 mg

## 2020-06-03 MED ORDER — CEFAZOLIN SODIUM-DEXTROSE 2-4 GM/100ML-% IV SOLN
2.0000 g | INTRAVENOUS | Status: AC
  Administered 2020-06-03: 2 g via INTRAVENOUS

## 2020-06-03 MED ORDER — LIDOCAINE 2% (20 MG/ML) 5 ML SYRINGE
INTRAMUSCULAR | Status: DC | PRN
  Administered 2020-06-03: 50 mg via INTRAVENOUS

## 2020-06-03 MED ORDER — PROPOFOL 10 MG/ML IV BOLUS
INTRAVENOUS | Status: DC | PRN
  Administered 2020-06-03: 120 mg via INTRAVENOUS

## 2020-06-03 MED ORDER — CEFAZOLIN SODIUM-DEXTROSE 2-4 GM/100ML-% IV SOLN: 2.0000 g | INTRAVENOUS | Status: DC

## 2020-06-03 MED ORDER — EPHEDRINE 5 MG/ML INJ
INTRAVENOUS | Status: AC
  Filled 2020-06-03: qty 10

## 2020-06-03 MED ORDER — ARTIFICIAL TEARS OPHTHALMIC OINT
TOPICAL_OINTMENT | OPHTHALMIC | Status: AC
  Filled 2020-06-03: qty 3.5

## 2020-06-03 MED ORDER — OXYCODONE HCL 5 MG PO TABS: 5.0000 mg | ORAL_TABLET | Freq: Once | ORAL | Status: DC | PRN

## 2020-06-03 MED ORDER — TECHNETIUM TC 99M TILMANOCEPT KIT
1.0000 | PACK | Freq: Once | INTRAVENOUS | Status: AC | PRN
  Administered 2020-06-03: 1 via INTRADERMAL

## 2020-06-03 MED ORDER — LIDOCAINE 2% (20 MG/ML) 5 ML SYRINGE
INTRAMUSCULAR | Status: AC
  Filled 2020-06-03: qty 10

## 2020-06-03 MED ORDER — ACETAMINOPHEN 325 MG PO TABS
325.0000 mg | ORAL_TABLET | ORAL | Status: DC | PRN
Start: 2020-06-03 — End: 2020-06-03

## 2020-06-03 MED ORDER — ACETAMINOPHEN 500 MG PO TABS
1000.0000 mg | ORAL_TABLET | ORAL | Status: AC
  Administered 2020-06-03: 1000 mg via ORAL

## 2020-06-03 SURGICAL SUPPLY — 43 items
ADH SKN CLS APL DERMABOND .7 (GAUZE/BANDAGES/DRESSINGS) ×1
APL PRP STRL LF DISP 70% ISPRP (MISCELLANEOUS) ×1
APPLIER CLIP 9.375 MED OPEN (MISCELLANEOUS) ×2
APR CLP MED 9.3 20 MLT OPN (MISCELLANEOUS) ×1
BINDER BREAST LRG (GAUZE/BANDAGES/DRESSINGS) ×1 IMPLANT
BLADE SURG 15 STRL LF DISP TIS (BLADE) ×1 IMPLANT
BLADE SURG 15 STRL SS (BLADE) ×2
CANISTER SUC SOCK COL 7IN (MISCELLANEOUS) IMPLANT
CANISTER SUCT 1200ML W/VALVE (MISCELLANEOUS) IMPLANT
CHLORAPREP W/TINT 26 (MISCELLANEOUS) ×2 IMPLANT
CLIP APPLIE 9.375 MED OPEN (MISCELLANEOUS) ×1 IMPLANT
COVER BACK TABLE 60X90IN (DRAPES) ×2 IMPLANT
COVER MAYO STAND STRL (DRAPES) ×2 IMPLANT
COVER PROBE W GEL 5X96 (DRAPES) ×2 IMPLANT
DERMABOND ADVANCED (GAUZE/BANDAGES/DRESSINGS) ×1
DERMABOND ADVANCED .7 DNX12 (GAUZE/BANDAGES/DRESSINGS) ×1 IMPLANT
DRAPE LAPAROSCOPIC ABDOMINAL (DRAPES) ×2 IMPLANT
DRAPE UTILITY XL STRL (DRAPES) ×2 IMPLANT
ELECT COATED BLADE 2.86 ST (ELECTRODE) ×2 IMPLANT
ELECT REM PT RETURN 9FT ADLT (ELECTROSURGICAL) ×2
ELECTRODE REM PT RTRN 9FT ADLT (ELECTROSURGICAL) ×1 IMPLANT
GLOVE SURG ENC MOIS LTX SZ6.5 (GLOVE) ×2 IMPLANT
GLOVE SURG ENC MOIS LTX SZ7.5 (GLOVE) ×2 IMPLANT
GOWN STRL REUS W/ TWL LRG LVL3 (GOWN DISPOSABLE) ×2 IMPLANT
GOWN STRL REUS W/TWL LRG LVL3 (GOWN DISPOSABLE) ×6
KIT MARKER MARGIN INK (KITS) ×2 IMPLANT
NDL HYPO 25X1 1.5 SAFETY (NEEDLE) ×1 IMPLANT
NDL SAFETY ECLIPSE 18X1.5 (NEEDLE) IMPLANT
NEEDLE HYPO 18GX1.5 SHARP (NEEDLE)
NEEDLE HYPO 25X1 1.5 SAFETY (NEEDLE) ×2 IMPLANT
NS IRRIG 1000ML POUR BTL (IV SOLUTION) ×1 IMPLANT
PACK BASIN DAY SURGERY FS (CUSTOM PROCEDURE TRAY) ×2 IMPLANT
PENCIL SMOKE EVACUATOR (MISCELLANEOUS) ×2 IMPLANT
SLEEVE SCD COMPRESS KNEE MED (MISCELLANEOUS) ×2 IMPLANT
SPONGE LAP 18X18 RF (DISPOSABLE) ×2 IMPLANT
SUT MON AB 4-0 PC3 18 (SUTURE) ×4 IMPLANT
SUT SILK 2 0 SH (SUTURE) IMPLANT
SUT VICRYL 3-0 CR8 SH (SUTURE) ×2 IMPLANT
SYR CONTROL 10ML LL (SYRINGE) ×2 IMPLANT
TOWEL GREEN STERILE FF (TOWEL DISPOSABLE) ×2 IMPLANT
TRAY FAXITRON CT DISP (TRAY / TRAY PROCEDURE) ×1 IMPLANT
TUBE CONNECTING 20X1/4 (TUBING) IMPLANT
YANKAUER SUCT BULB TIP NO VENT (SUCTIONS) IMPLANT

## 2020-06-03 NOTE — Progress Notes (Signed)
Assisted Dr. Oddono with left, ultrasound guided, pectoralis block. Side rails up, monitors on throughout procedure. See vital signs in flow sheet. Tolerated Procedure well. °

## 2020-06-03 NOTE — Interval H&P Note (Signed)
History and Physical Interval Note:  06/03/2020 1:35 PM  Sheena White  has presented today for surgery, with the diagnosis of LEFT BREAST CANCER.  The various methods of treatment have been discussed with the patient and family. After consideration of risks, benefits and other options for treatment, the patient has consented to  Procedure(s) with comments: LEFT BREAST LUMPECTOMY WITH RADIOACTIVE SEED AND SENTINEL LYMPH NODE BIOPSY (Left) - GEN AND PEC BLOCK as a surgical intervention.  The patient's history has been reviewed, patient examined, no change in status, stable for surgery.  I have reviewed the patient's chart and labs.  Questions were answered to the patient's satisfaction.     Autumn Messing III

## 2020-06-03 NOTE — Anesthesia Procedure Notes (Signed)
Procedure Name: LMA Insertion Date/Time: 06/03/2020 2:15 PM Performed by: Genelle Bal, CRNA Pre-anesthesia Checklist: Patient identified, Emergency Drugs available, Suction available and Patient being monitored Patient Re-evaluated:Patient Re-evaluated prior to induction Oxygen Delivery Method: Circle system utilized Preoxygenation: Pre-oxygenation with 100% oxygen Induction Type: IV induction Ventilation: Mask ventilation without difficulty LMA: LMA inserted LMA Size: 4.0 Number of attempts: 1 Airway Equipment and Method: Bite block Placement Confirmation: positive ETCO2 Tube secured with: Tape Dental Injury: Teeth and Oropharynx as per pre-operative assessment

## 2020-06-03 NOTE — Anesthesia Postprocedure Evaluation (Signed)
Anesthesia Post Note  Patient: Dyanne Yorks  Procedure(s) Performed: LEFT BREAST LUMPECTOMY WITH RADIOACTIVE SEED AND SENTINEL LYMPH NODE BIOPSY (Left Breast)     Patient location during evaluation: PACU Anesthesia Type: General Level of consciousness: awake and alert Pain management: pain level controlled Vital Signs Assessment: post-procedure vital signs reviewed and stable Respiratory status: spontaneous breathing, nonlabored ventilation, respiratory function stable and patient connected to nasal cannula oxygen Cardiovascular status: blood pressure returned to baseline and stable Postop Assessment: no apparent nausea or vomiting Anesthetic complications: no   No complications documented.  Last Vitals:  Vitals:   06/03/20 1513 06/03/20 1514  BP: (!) 101/53   Pulse:  73  Resp:    Temp:    SpO2:  100%    Last Pain:  Vitals:   06/03/20 1331  TempSrc: Oral  PainSc: 0-No pain                 Rada Zegers

## 2020-06-03 NOTE — H&P (Addendum)
Sheena White  Location: [facility] Patient #: [id number] DOB: 03/30/1951 Undefined / Language: Cleophus Molt / Race: White Female   History of Present Illness  The patient is a 70 year old female who presents with breast cancer.We are asked to see the patient in consultation by Dr. Jana Hakim to evaluate her for a new left breast cancer. The patient is a 70 year old white female who presents with a mass in the UOQ of the left breast measuring 1.2cm and the nodes looked normal. This was biopsied and came back as an [place] that was ER and PR+ and Her2- with a Ki67 of 2%. She is otherwise in good health and does not smoke   Past Surgical History Breast Biopsy  Left. Cataract Surgery  Bilateral. Oral Surgery  Shoulder Surgery  Left. Tonsillectomy   Diagnostic Studies History Colonoscopy  1-5 years ago Mammogram  within last year Pap Smear  1-5 years ago  Medication History  Medications Reconciled  Social History  Alcohol use  Occasional alcohol use. Caffeine use  Coffee, Tea. No drug use  Tobacco use  Never smoker.  Family History  Alcohol Abuse  Father. Arthritis  Father. Cerebrovascular Accident  Mother. Colon Cancer  Family Members In General. Diabetes Mellitus  Family Members In General. Heart Disease  Father. Hypertension  Brother. Kidney Disease  Brother. Migraine Headache  Daughter. Thyroid problems  Family Members In General.  Pregnancy / Birth History  Age at menarche  62 years. Age of menopause  39-50 Gravida  2 Length (months) of breastfeeding  3-6 Maternal age  52-20 Para  2  Other Problems  Anxiety Disorder  Arthritis  Gastroesophageal Reflux Disease  Lump In Breast     Review of Systems  General Not Present- Appetite Loss, Chills, Fatigue, Fever, Night Sweats, Weight Gain and Weight Loss. Skin Not Present- Change in Wart/Mole, Dryness, Hives, Jaundice, New Lesions, Non-Healing Wounds, Rash and Ulcer. HEENT  Present- Seasonal Allergies and Wears glasses/contact lenses. Not Present- Earache, Hearing Loss, Hoarseness, Nose Bleed, Oral Ulcers, Ringing in the Ears, Sinus Pain, Sore Throat, Visual Disturbances and Yellow Eyes. Respiratory Not Present- Bloody sputum, Chronic Cough, Difficulty Breathing, Snoring and Wheezing. Breast Present- Breast Mass. Not Present- Breast Pain, Nipple Discharge and Skin Changes. Cardiovascular Not Present- Chest Pain, Difficulty Breathing Lying Down, Leg Cramps, Palpitations, Rapid Heart Rate, Shortness of Breath and Swelling of Extremities. Gastrointestinal Not Present- Abdominal Pain, Bloating, Bloody Stool, Change in Bowel Habits, Chronic diarrhea, Constipation, Difficulty Swallowing, Excessive gas, Gets full quickly at meals, Hemorrhoids, Indigestion, Nausea, Rectal Pain and Vomiting. Female Genitourinary Not Present- Frequency, Nocturia, Painful Urination, Pelvic Pain and Urgency. Musculoskeletal Not Present- Back Pain, Joint Pain, Joint Stiffness, Muscle Pain, Muscle Weakness and Swelling of Extremities. Neurological Not Present- Decreased Memory, Fainting, Headaches, Numbness, Seizures, Tingling, Tremor, Trouble walking and Weakness. Psychiatric Present- Anxiety. Not Present- Bipolar, Change in Sleep Pattern, Depression, Fearful and Frequent crying. Endocrine Present- Cold Intolerance. Not Present- Excessive Hunger, Hair Changes, Heat Intolerance, Hot flashes and New Diabetes. Hematology Not Present- Blood Thinners, Easy Bruising, Excessive bleeding, Gland problems, HIV and Persistent Infections.   Physical Exam  General Mental Status-Alert. General Appearance-Consistent with stated age. Hydration-Well hydrated. Voice-Normal.  Head and Neck Head-normocephalic, atraumatic with no lesions or palpable masses. Trachea-midline. Thyroid Gland Characteristics - normal size and consistency.  Eye Eyeball - Bilateral-Extraocular movements  intact. Sclera/Conjunctiva - Bilateral-No scleral icterus.  Chest and Lung Exam Chest and lung exam reveals -quiet, even and easy respiratory effort with no  use of accessory muscles and on auscultation, normal breath sounds, no adventitious sounds and normal vocal resonance. Inspection Chest Wall - Normal. Back - normal.  Breast Note: There is a small palpable bruise in the UOQ of the left breast. other than this there is no palpable mass in either breast. there is no palpable axillary, supraclavicular, or cervical lymphadenopathy   Cardiovascular Cardiovascular examination reveals -normal heart sounds, regular rate and rhythm with no murmurs and normal pedal pulses bilaterally.  Abdomen Inspection Inspection of the abdomen reveals - No Hernias. Skin - Scar - no surgical scars. Palpation/Percussion Palpation and Percussion of the abdomen reveal - Soft, Non Tender, No Rebound tenderness, No Rigidity (guarding) and No hepatosplenomegaly. Auscultation Auscultation of the abdomen reveals - Bowel sounds normal.  Neurologic Neurologic evaluation reveals -alert and oriented x 3 with no impairment of recent or remote memory. Mental Status-Normal.  Musculoskeletal Normal Exam - Left-Upper Extremity Strength Normal and Lower Extremity Strength Normal. Normal Exam - Right-Upper Extremity Strength Normal and Lower Extremity Strength Normal.  Lymphatic Head & Neck  General Head & Neck Lymphatics: Bilateral - Description - Normal. Axillary  General Axillary Region: Bilateral - Description - Normal. Tenderness - Non Tender. Femoral & Inguinal  Generalized Femoral & Inguinal Lymphatics: Bilateral - Description - Normal. Tenderness - Non Tender.    Assessment & Plan  MALIGNANT NEOPLASM OF UPPER-OUTER QUADRANT OF LEFT BREAST IN FEMALE, ESTROGEN RECEPTOR POSITIVE (C50.412) Impression: The patient appears to have a stage 1 ILC of the UOQ of the left breast. Given the lobular  nature we will plan to further evaluate this with MRI. I have discussed with her in detail the risks and benefits of the surgery as well as some of the technical aspects including the use of a radioactive seed for localization and she understands and wishes to proceed. She will meet with medical and radiation oncology to discuss adjuvant therapy as well. we will call her with results of the mri. This patient encounter took 60 minutes today to perform the following: take history, perform exam, review outside records, interpret imaging, counsel the patient on their diagnosis and document encounter, findings & plan in the EHR Current Plans Referred to Oncology, for evaluation and follow up (Oncology). Routine. Referred to Physical Therapy, for evaluation and follow up (Physical Therapy). Routine.  MRI shows area to be larger than what is seen on mammogram. Will try to bracket the area with seeds and continue with breast conservation

## 2020-06-03 NOTE — Transfer of Care (Signed)
Immediate Anesthesia Transfer of Care Note  Patient: Sheena White  Procedure(s) Performed: LEFT BREAST LUMPECTOMY WITH RADIOACTIVE SEED AND SENTINEL LYMPH NODE BIOPSY (Left Breast)  Patient Location: PACU  Anesthesia Type:GA combined with regional for post-op pain  Level of Consciousness: awake, alert  and oriented  Airway & Oxygen Therapy: Patient Spontanous Breathing and Patient connected to face mask oxygen  Post-op Assessment: Report given to RN and Post -op Vital signs reviewed and stable  Post vital signs: Reviewed and stable  Last Vitals:  Vitals Value Taken Time  BP 102/51 06/03/20 1515  Temp    Pulse 72 06/03/20 1515  Resp 11 06/03/20 1515  SpO2 100 % 06/03/20 1515  Vitals shown include unvalidated device data.  Last Pain:  Vitals:   06/03/20 1331  TempSrc: Oral  PainSc: 0-No pain         Complications: No complications documented.

## 2020-06-03 NOTE — Anesthesia Preprocedure Evaluation (Signed)
Anesthesia Evaluation  Patient identified by MRN, date of birth, ID band Patient awake    Reviewed: Allergy & Precautions, H&P , NPO status , Patient's Chart, lab work & pertinent test results, reviewed documented beta blocker date and time   History of Anesthesia Complications (+) PONV and history of anesthetic complications  Airway Mallampati: II  TM Distance: >3 FB Neck ROM: full    Dental no notable dental hx. (+) Teeth Intact   Pulmonary neg pulmonary ROS,    Pulmonary exam normal breath sounds clear to auscultation       Cardiovascular Exercise Tolerance: Good negative cardio ROS   Rhythm:regular Rate:Normal     Neuro/Psych Anxiety negative neurological ROS  negative psych ROS   GI/Hepatic Neg liver ROS, GERD  ,  Endo/Other  negative endocrine ROS  Renal/GU negative Renal ROS  negative genitourinary   Musculoskeletal  (+) Arthritis , Osteoarthritis,    Abdominal   Peds  Hematology negative hematology ROS (+)   Anesthesia Other Findings   Reproductive/Obstetrics negative OB ROS                             Anesthesia Physical Anesthesia Plan  ASA: III  Anesthesia Plan: General   Post-op Pain Management: GA combined w/ Regional for post-op pain   Induction:   PONV Risk Score and Plan: 3 and Ondansetron, Dexamethasone, TIVA and Midazolam  Airway Management Planned: Oral ETT and LMA  Additional Equipment:   Intra-op Plan:   Post-operative Plan: Extubation in OR  Informed Consent: I have reviewed the patients History and Physical, chart, labs and discussed the procedure including the risks, benefits and alternatives for the proposed anesthesia with the patient or authorized representative who has indicated his/her understanding and acceptance.     Dental Advisory Given  Plan Discussed with: CRNA, Anesthesiologist and Surgeon  Anesthesia Plan Comments: (Discussed  both nerve block for pain relief post-op and GA; including NV, sore throat, dental injury, and pulmonary complications)        Anesthesia Quick Evaluation

## 2020-06-03 NOTE — Discharge Instructions (Signed)
No Tylenol until 8:00pm if needed ° ° °Post Anesthesia Home Care Instructions ° °Activity: °Get plenty of rest for the remainder of the day. A responsible individual must stay with you for 24 hours following the procedure.  °For the next 24 hours, DO NOT: °-Drive a car °-Operate machinery °-Drink alcoholic beverages °-Take any medication unless instructed by your physician °-Make any legal decisions or sign important papers. ° °Meals: °Start with liquid foods such as gelatin or soup. Progress to regular foods as tolerated. Avoid greasy, spicy, heavy foods. If nausea and/or vomiting occur, drink only clear liquids until the nausea and/or vomiting subsides. Call your physician if vomiting continues. ° °Special Instructions/Symptoms: °Your throat may feel dry or sore from the anesthesia or the breathing tube placed in your throat during surgery. If this causes discomfort, gargle with warm salt water. The discomfort should disappear within 24 hours. ° °If you had a scopolamine patch placed behind your ear for the management of post- operative nausea and/or vomiting: ° °1. The medication in the patch is effective for 72 hours, after which it should be removed.  Wrap patch in a tissue and discard in the trash. Wash hands thoroughly with soap and water. °2. You may remove the patch earlier than 72 hours if you experience unpleasant side effects which may include dry mouth, dizziness or visual disturbances. °3. Avoid touching the patch. Wash your hands with soap and water after contact with the patch. °   ° °

## 2020-06-03 NOTE — Anesthesia Procedure Notes (Signed)
Anesthesia Regional Block: Pectoralis block   Pre-Anesthetic Checklist: ,, timeout performed, Correct Patient, Correct Site, Correct Laterality, Correct Procedure, Correct Position, site marked, Risks and benefits discussed,  Surgical consent,  Pre-op evaluation,  At surgeon's request and post-op pain management  Laterality: Left  Prep: chloraprep       Needles:  Injection technique: Single-shot  Needle Type: Echogenic Stimulator Needle     Needle Length: 5cm  Needle Gauge: 22     Additional Needles:   Procedures:, nerve stimulator,,, ultrasound used (permanent image in chart),,,,  Narrative:  Start time: 06/03/2020 1:55 PM End time: 06/03/2020 1:58 PM Injection made incrementally with aspirations every 5 mL.  Performed by: Personally  Anesthesiologist: Janeece Riggers, MD  Additional Notes: Functioning IV was confirmed and monitors were applied.  A 61mm 22ga Arrow echogenic stimulator needle was used. Sterile prep and drape,hand hygiene and sterile gloves were used. Ultrasound guidance: relevant anatomy identified, needle position confirmed, local anesthetic spread visualized around nerve(s)., vascular puncture avoided.  Image printed for medical record. Negative aspiration and negative test dose prior to incremental administration of local anesthetic. The patient tolerated the procedure well.      NO PIK

## 2020-06-03 NOTE — Op Note (Signed)
06/03/2020  3:09 PM  PATIENT:  Sheena White  70 y.o. female  PRE-OPERATIVE DIAGNOSIS:  LEFT BREAST CANCER  POST-OPERATIVE DIAGNOSIS:  LEFT BREAST CANCER  PROCEDURE:  Procedure(s): LEFT BREAST LUMPECTOMY WITH RADIOACTIVE SEED LOCALIZATION AND DEEP LEFT AXILLARY SENTINEL LYMPH NODE BIOPSY (Left)  SURGEON:  Surgeon(s) and Role:    * Jovita Kussmaul, MD - Primary  PHYSICIAN ASSISTANT:   ASSISTANTS: none   ANESTHESIA:   local and general  EBL:  5 mL   BLOOD ADMINISTERED:none  DRAINS: none   LOCAL MEDICATIONS USED:  MARCAINE     SPECIMEN:  Source of Specimen:  left breast tissue and sentinel node  DISPOSITION OF SPECIMEN:  PATHOLOGY  COUNTS:  YES  TOURNIQUET:  * No tourniquets in log *  DICTATION: .Dragon Dictation   After informed consent was obtained the patient was brought to the operating room and placed in the supine position on the operating table.  After adequate induction of general anesthesia the patient's left chest, breast, and axillary area were prepped with ChloraPrep, allowed to dry, and draped in usual sterile manner.  An appropriate timeout was performed.  Previously 2 I-125 seeds were placed in the outer aspect of the left breast to bracket an area of invasive breast cancer.  Also earlier in the day the patient underwent injection 1 mCi of technetium sulfur colloid in the subareolar position on the left.  Attention was first turned to the left axilla.  The neoprobe was set to technetium and there was good signal in the left axilla.  This area was infiltrated with quarter percent Marcaine.  A small transversely oriented incision was made with a 15 blade knife overlying the area of radioactivity.  The incision was carried through the skin and subcutaneous tissue sharply with the electrocautery until the deep left axillary space was entered.  The neoprobe was used to direct blunt hemostat dissection.  I was able to identify a hot lymph node.  This lymph node was  excised sharply with the electrocautery and the surrounding small vessels and lymphatics were controlled with clips.  Ex vivo counts on this node were approximately 400.  No other hot or palpable nodes were identified in the left axilla.  Hemostasis was achieved using the Bovie electrocautery.  The deep layer of the incision was then closed with interrupted 3-0 Vicryl stitches.  The skin was closed with a running 4-0 Monocryl subcuticular stitch.  Attention was then turned to the left breast.  The neoprobe was set to I-125 and the 2 radioactive seeds were identified.  I elected to make a radially oriented elliptical incision in the skin overlying the radioactive seeds with a 15 blade knife.  The incision was carried through the skin and subcutaneous tissue sharply with the electrocautery.  Dissection was then carried around the 2 radioactive seeds and the dissection was carried all the way to the chest wall.  Once the specimen was removed it was oriented with the appropriate paint colors.  A specimen radiograph was obtained that showed both seeds and the one clip near the center of the specimen.  The specimen was then sent to pathology for further evaluation.  Hemostasis was achieved using the Bovie electrocautery.  The wound was irrigated with saline and infiltrated with more quarter percent Marcaine.  The cavity was marked with clips.  The deep layer of the wound was closed with layers of interrupted 3-0 Vicryl stitches.  The skin was then closed with a running 4-0 Monocryl  subcuticular stitch.  Dermabond dressings were applied.  The patient tolerated the procedure well.  At the end of the case all needle sponge and instrument counts were correct.  The patient was then awakened and taken to recovery in stable condition.  PLAN OF CARE: Discharge to home after PACU  PATIENT DISPOSITION:  PACU - hemodynamically stable.   Delay start of Pharmacological VTE agent (>24hrs) due to surgical blood loss or risk of  bleeding: not applicable

## 2020-06-04 ENCOUNTER — Encounter (HOSPITAL_BASED_OUTPATIENT_CLINIC_OR_DEPARTMENT_OTHER): Payer: Self-pay | Admitting: General Surgery

## 2020-06-08 LAB — SURGICAL PATHOLOGY

## 2020-06-09 ENCOUNTER — Telehealth: Payer: Self-pay | Admitting: *Deleted

## 2020-06-09 ENCOUNTER — Encounter: Payer: Self-pay | Admitting: *Deleted

## 2020-06-09 NOTE — Telephone Encounter (Signed)
Ordered mammaprint per Dr. Jana Hakim.  Faxed requisition to [facility].

## 2020-06-10 ENCOUNTER — Other Ambulatory Visit: Payer: Self-pay | Admitting: Oncology

## 2020-06-10 DIAGNOSIS — C50412 Malignant neoplasm of upper-outer quadrant of left female breast: Secondary | ICD-10-CM | POA: Diagnosis not present

## 2020-06-12 DIAGNOSIS — Z170 Estrogen receptor positive status [ER+]: Secondary | ICD-10-CM | POA: Diagnosis not present

## 2020-06-12 DIAGNOSIS — D0502 Lobular carcinoma in situ of left breast: Secondary | ICD-10-CM | POA: Diagnosis not present

## 2020-06-12 DIAGNOSIS — C50912 Malignant neoplasm of unspecified site of left female breast: Secondary | ICD-10-CM | POA: Diagnosis not present

## 2020-06-12 DIAGNOSIS — C773 Secondary and unspecified malignant neoplasm of axilla and upper limb lymph nodes: Secondary | ICD-10-CM | POA: Diagnosis not present

## 2020-06-15 ENCOUNTER — Encounter: Payer: Self-pay | Admitting: Genetic Counselor

## 2020-06-15 ENCOUNTER — Telehealth: Payer: Self-pay | Admitting: Oncology

## 2020-06-15 DIAGNOSIS — C50812 Malignant neoplasm of overlapping sites of left female breast: Secondary | ICD-10-CM | POA: Diagnosis not present

## 2020-06-15 NOTE — Telephone Encounter (Signed)
Release: [phone] Faxed medical records to [employer] @ [phone]

## 2020-06-16 ENCOUNTER — Telehealth: Payer: Self-pay | Admitting: *Deleted

## 2020-06-16 ENCOUNTER — Encounter (HOSPITAL_COMMUNITY): Payer: Self-pay

## 2020-06-16 ENCOUNTER — Encounter: Payer: Self-pay | Admitting: *Deleted

## 2020-06-16 NOTE — Telephone Encounter (Signed)
Received mammaprint results of low risk. Patient is aware.  Was going to make referral back to Dr. Lisbeth Renshaw but patient informed me that she is going to [facility] 2/24 for a second opinion. She states she has received great care here, but it has just taken a long time to get her surgery and she feels like everything was moving really slow. Informed her I would let the appropriate people know of her concerns. She states she does not want to cancel her appointments yet and she will call me and let me know her decision.  Contact information given.

## 2020-06-18 DIAGNOSIS — C50412 Malignant neoplasm of upper-outer quadrant of left female breast: Secondary | ICD-10-CM | POA: Diagnosis not present

## 2020-06-18 DIAGNOSIS — C50912 Malignant neoplasm of unspecified site of left female breast: Secondary | ICD-10-CM | POA: Diagnosis not present

## 2020-06-18 DIAGNOSIS — Z170 Estrogen receptor positive status [ER+]: Secondary | ICD-10-CM | POA: Diagnosis not present

## 2020-06-19 ENCOUNTER — Encounter: Payer: Self-pay | Admitting: Oncology

## 2020-06-19 ENCOUNTER — Encounter: Payer: Self-pay | Admitting: Physical Therapy

## 2020-06-19 DIAGNOSIS — C50912 Malignant neoplasm of unspecified site of left female breast: Secondary | ICD-10-CM | POA: Diagnosis not present

## 2020-06-23 DIAGNOSIS — Z8616 Personal history of COVID-19: Secondary | ICD-10-CM

## 2020-06-23 HISTORY — DX: Personal history of COVID-19: Z86.16

## 2020-06-25 DIAGNOSIS — M549 Dorsalgia, unspecified: Secondary | ICD-10-CM | POA: Diagnosis not present

## 2020-06-25 DIAGNOSIS — C773 Secondary and unspecified malignant neoplasm of axilla and upper limb lymph nodes: Secondary | ICD-10-CM | POA: Diagnosis not present

## 2020-06-25 DIAGNOSIS — R928 Other abnormal and inconclusive findings on diagnostic imaging of breast: Secondary | ICD-10-CM | POA: Diagnosis not present

## 2020-06-25 DIAGNOSIS — N63 Unspecified lump in unspecified breast: Secondary | ICD-10-CM | POA: Diagnosis not present

## 2020-06-25 DIAGNOSIS — C50912 Malignant neoplasm of unspecified site of left female breast: Secondary | ICD-10-CM | POA: Diagnosis not present

## 2020-06-25 DIAGNOSIS — C50412 Malignant neoplasm of upper-outer quadrant of left female breast: Secondary | ICD-10-CM | POA: Diagnosis not present

## 2020-06-25 DIAGNOSIS — Z9889 Other specified postprocedural states: Secondary | ICD-10-CM | POA: Diagnosis not present

## 2020-06-25 DIAGNOSIS — Z170 Estrogen receptor positive status [ER+]: Secondary | ICD-10-CM | POA: Diagnosis not present

## 2020-06-26 ENCOUNTER — Encounter: Payer: Self-pay | Admitting: *Deleted

## 2020-06-29 ENCOUNTER — Ambulatory Visit: Payer: Medicare PPO | Admitting: Physical Therapy

## 2020-06-29 ENCOUNTER — Ambulatory Visit: Payer: Medicare PPO | Admitting: Oncology

## 2020-07-01 ENCOUNTER — Ambulatory Visit: Payer: Medicare PPO | Admitting: Physical Therapy

## 2020-07-07 DIAGNOSIS — Z9012 Acquired absence of left breast and nipple: Secondary | ICD-10-CM | POA: Diagnosis not present

## 2020-07-07 DIAGNOSIS — C50012 Malignant neoplasm of nipple and areola, left female breast: Secondary | ICD-10-CM | POA: Diagnosis not present

## 2020-07-07 DIAGNOSIS — C50912 Malignant neoplasm of unspecified site of left female breast: Secondary | ICD-10-CM | POA: Diagnosis not present

## 2020-07-14 DIAGNOSIS — C50912 Malignant neoplasm of unspecified site of left female breast: Secondary | ICD-10-CM | POA: Diagnosis not present

## 2020-07-20 ENCOUNTER — Other Ambulatory Visit: Payer: Self-pay | Admitting: Oncology

## 2020-07-20 ENCOUNTER — Telehealth: Payer: Self-pay | Admitting: *Deleted

## 2020-07-20 ENCOUNTER — Encounter: Payer: Self-pay | Admitting: Family Medicine

## 2020-07-20 ENCOUNTER — Encounter: Payer: Self-pay | Admitting: *Deleted

## 2020-07-20 NOTE — Telephone Encounter (Signed)
Called and spoke with patient to follow up and see if she made a decision regarding where she will receive tx. She states she will be receiving all care at [facility] and that she called to cancel her appointments with [place] but I couldn't find a note to that effect. Informed I would let the team know and told her if we could be of any assistance to let us know.Patient verbalized understanding.

## 2020-07-22 ENCOUNTER — Ambulatory Visit: Payer: Medicare PPO | Admitting: Family Medicine

## 2020-07-22 ENCOUNTER — Encounter: Payer: Self-pay | Admitting: Family Medicine

## 2020-07-22 ENCOUNTER — Other Ambulatory Visit: Payer: Self-pay

## 2020-07-22 VITALS — BP 115/74 | HR 73 | Ht 64.0 in | Wt 123.2 lb

## 2020-07-22 DIAGNOSIS — R1011 Right upper quadrant pain: Secondary | ICD-10-CM

## 2020-07-22 DIAGNOSIS — R197 Diarrhea, unspecified: Secondary | ICD-10-CM

## 2020-07-22 NOTE — Progress Notes (Signed)
Office Visit Note   Patient: Sheena White           Date of Birth: 05/10/1950           MRN: [id number] Visit Date: 07/22/2020 Requested by: Eunice Blase, MD [address] [place],  [place] [address] PCP: Eunice Blase, MD  Subjective: Chief Complaint  Patient presents with  . Other    "stomach issues"    HPI: She is here with right upper quadrant abdominal pain and diarrhea.  Symptoms started a few weeks ago.  She is undergoing treatment for breast cancer.  She is taking over-the-counter supplements and eating healthfully.  She is wanting to continue taking supplements to facilitate her treatment, but is concerned that it might be affecting her liver.  In the past she has had liver enzyme elevation with different medications and she felt similar to how she feels right now.               ROS:   All other systems were reviewed and are negative.  Objective: Vital Signs: BP 115/74   Pulse 73   Ht 5\' 4"  (1.626 m)   Wt 123 lb 3.2 oz (55.9 kg)   BMI 21.15 kg/m   Physical Exam:  General:  Alert and oriented, in no acute distress. Pulm:  Breathing unlabored. Psy:  Normal mood, congruent affect. Skin: No jaundice Abdomen: No hepatosplenomegaly, no palpable masses.  Bowel sounds are active.  Imaging: No results found.  Assessment & Plan: 1.  Right upper quadrant abdominal pain with diarrhea -Labs to evaluate.  Could contemplate ultrasound if symptoms persist.     Procedures: No procedures performed        PMFS History: Patient Active Problem List   Diagnosis Date Noted  . Malignant neoplasm of upper-outer quadrant of left breast in female, estrogen receptor positive ([place]) 04/13/2020   Past Medical History:  Diagnosis Date  . Anxiety   . Arthritis    mild  . Breast cancer ([place])   . Complication of anesthesia    very easily sedate  . GERD (gastroesophageal reflux disease)   . PONV (postoperative nausea and vomiting)   . Psoriasis   . Raynaud's disease    . Skin cancer 2000    Family History  Problem Relation Age of Onset  . Early death Mother   . Stroke Mother   . Early death Father   . Heart disease Father   . Colon cancer Maternal Aunt   . Alcohol abuse Brother   . Drug abuse Brother   . Varicose Veins Maternal Grandmother     Past Surgical History:  Procedure Laterality Date  . BREAST LUMPECTOMY WITH RADIOACTIVE SEED AND SENTINEL LYMPH NODE BIOPSY Left 06/03/2020   Procedure: LEFT BREAST LUMPECTOMY WITH RADIOACTIVE SEED AND SENTINEL LYMPH NODE BIOPSY;  Surgeon: Jovita Kussmaul, MD;  Location: [facility];  Service: General;  Laterality: Left;  . CATARACT EXTRACTION    . EYE SURGERY  02/23/2018   cataract  . SHOULDER ACROMIOPLASTY    . TONSILLECTOMY     Social History   Occupational History  . Not on file  Tobacco Use  . Smoking status: Never Smoker  . Smokeless tobacco: Never Used  Substance and Sexual Activity  . Alcohol use: Yes    Alcohol/week: 2.0 standard drinks    Types: 2 Glasses of wine per week  . Drug use: Never  . Sexual activity: Not Currently

## 2020-07-23 ENCOUNTER — Telehealth: Payer: Self-pay | Admitting: Family Medicine

## 2020-07-23 DIAGNOSIS — R739 Hyperglycemia, unspecified: Secondary | ICD-10-CM

## 2020-07-23 DIAGNOSIS — R7989 Other specified abnormal findings of blood chemistry: Secondary | ICD-10-CM

## 2020-07-23 DIAGNOSIS — D721 Eosinophilia, unspecified: Secondary | ICD-10-CM

## 2020-07-23 LAB — CBC WITH DIFFERENTIAL/PLATELET
Absolute Monocytes: 776 cells/uL (ref 200–950)
Basophils Absolute: 39 cells/uL (ref 0–200)
Basophils Relative: 0.4 %
Eosinophils Absolute: 1756 cells/uL — ABNORMAL HIGH (ref 15–500)
Eosinophils Relative: 18.1 %
HCT: 40.8 % (ref 35.0–45.0)
Hemoglobin: 13.1 g/dL (ref 11.7–15.5)
Lymphs Abs: 2959 cells/uL (ref 850–3900)
MCH: 29.7 pg (ref 27.0–33.0)
MCHC: 32.1 g/dL (ref 32.0–36.0)
MCV: 92.5 fL (ref 80.0–100.0)
MPV: 10.3 fL (ref 7.5–12.5)
Monocytes Relative: 8 %
Neutro Abs: 4171 cells/uL (ref 1500–7800)
Neutrophils Relative %: 43 %
Platelets: 305 10*3/uL (ref 140–400)
RBC: 4.41 10*6/uL (ref 3.80–5.10)
RDW: 13 % (ref 11.0–15.0)
Total Lymphocyte: 30.5 %
WBC: 9.7 10*3/uL (ref 3.8–10.8)

## 2020-07-23 LAB — BASIC METABOLIC PANEL
BUN: 13 mg/dL (ref 7–25)
CO2: 29 mmol/L (ref 20–32)
Calcium: 9.7 mg/dL (ref 8.6–10.4)
Chloride: 101 mmol/L (ref 98–110)
Creat: 0.74 mg/dL (ref 0.50–0.99)
Glucose, Bld: 116 mg/dL — ABNORMAL HIGH (ref 65–99)
Potassium: 3.6 mmol/L (ref 3.5–5.3)
Sodium: 140 mmol/L (ref 135–146)

## 2020-07-23 LAB — HEPATIC FUNCTION PANEL
AG Ratio: 1.8 (calc) (ref 1.0–2.5)
ALT: 41 U/L — ABNORMAL HIGH (ref 6–29)
AST: 37 U/L — ABNORMAL HIGH (ref 10–35)
Albumin: 4.5 g/dL (ref 3.6–5.1)
Alkaline phosphatase (APISO): 90 U/L (ref 37–153)
Bilirubin, Direct: 0.1 mg/dL (ref 0.0–0.2)
Globulin: 2.5 g/dL (calc) (ref 1.9–3.7)
Indirect Bilirubin: 0.2 mg/dL (calc) (ref 0.2–1.2)
Total Bilirubin: 0.3 mg/dL (ref 0.2–1.2)
Total Protein: 7 g/dL (ref 6.1–8.1)

## 2020-07-23 LAB — LIPASE: Lipase: 38 U/L (ref 7–60)

## 2020-07-23 LAB — THYROID PANEL WITH TSH
Free Thyroxine Index: 2.4 (ref 1.4–3.8)
T3 Uptake: 27 % (ref 22–35)
T4, Total: 8.9 ug/dL (ref 5.1–11.9)
TSH: 7.29 mIU/L — ABNORMAL HIGH (ref 0.40–4.50)

## 2020-07-23 MED ORDER — LEVOTHYROXINE SODIUM 50 MCG PO TABS: 50.0000 ug | ORAL_TABLET | Freq: Every day | ORAL | 3 refills | Status: DC

## 2020-07-23 NOTE — Addendum Note (Signed)
Addended by: Hortencia Pilar on: 07/23/2020 08:19 AM   Modules accepted: Orders

## 2020-07-23 NOTE — Telephone Encounter (Signed)
Labs are notable for the following:  Liver enzymes are only very slightly elevated.  I am not sure whether this is the cause of your symptoms, but since you have had similar symptoms in the past with certain supplements, it would be worthwhile stopping them for a few weeks and then rechecking the liver enzymes.  Eosinophil counts are elevated in the CBC.  Reason is uncertain, but this often occurs with allergic reactions of some sort.  No changes needed right now, would recheck CBC in a few weeks as well.  Blood glucose is elevated at 116 but I do not think this was a fasting specimen, therefore it would still be considered normal.  Thyroid TSH is abnormal at 7.29 suggesting hypothyroidism.  This could be a cause of symptoms including fatigue, constipation, and multiple others.  Could contemplate treatment with a low dose of Synthroid, versus rechecking in 2 to 3 months.  All else looks good.

## 2020-07-24 DIAGNOSIS — N6320 Unspecified lump in the left breast, unspecified quadrant: Secondary | ICD-10-CM | POA: Diagnosis not present

## 2020-07-24 DIAGNOSIS — N6325 Unspecified lump in the left breast, overlapping quadrants: Secondary | ICD-10-CM | POA: Diagnosis not present

## 2020-07-24 DIAGNOSIS — R928 Other abnormal and inconclusive findings on diagnostic imaging of breast: Secondary | ICD-10-CM | POA: Diagnosis not present

## 2020-07-24 DIAGNOSIS — C50912 Malignant neoplasm of unspecified site of left female breast: Secondary | ICD-10-CM | POA: Diagnosis not present

## 2020-07-28 DIAGNOSIS — C50912 Malignant neoplasm of unspecified site of left female breast: Secondary | ICD-10-CM | POA: Diagnosis not present

## 2020-08-07 DIAGNOSIS — C50912 Malignant neoplasm of unspecified site of left female breast: Secondary | ICD-10-CM | POA: Diagnosis not present

## 2020-08-11 DIAGNOSIS — C50912 Malignant neoplasm of unspecified site of left female breast: Secondary | ICD-10-CM | POA: Diagnosis not present

## 2020-08-12 DIAGNOSIS — C50912 Malignant neoplasm of unspecified site of left female breast: Secondary | ICD-10-CM | POA: Diagnosis not present

## 2020-08-13 DIAGNOSIS — C50912 Malignant neoplasm of unspecified site of left female breast: Secondary | ICD-10-CM | POA: Diagnosis not present

## 2020-08-14 DIAGNOSIS — C50912 Malignant neoplasm of unspecified site of left female breast: Secondary | ICD-10-CM | POA: Diagnosis not present

## 2020-08-17 DIAGNOSIS — C50912 Malignant neoplasm of unspecified site of left female breast: Secondary | ICD-10-CM | POA: Diagnosis not present

## 2020-08-18 DIAGNOSIS — C50912 Malignant neoplasm of unspecified site of left female breast: Secondary | ICD-10-CM | POA: Diagnosis not present

## 2020-08-19 DIAGNOSIS — C50912 Malignant neoplasm of unspecified site of left female breast: Secondary | ICD-10-CM | POA: Diagnosis not present

## 2020-08-20 DIAGNOSIS — C50912 Malignant neoplasm of unspecified site of left female breast: Secondary | ICD-10-CM | POA: Diagnosis not present

## 2020-08-21 DIAGNOSIS — C50912 Malignant neoplasm of unspecified site of left female breast: Secondary | ICD-10-CM | POA: Diagnosis not present

## 2020-08-24 DIAGNOSIS — C50912 Malignant neoplasm of unspecified site of left female breast: Secondary | ICD-10-CM | POA: Diagnosis not present

## 2020-08-25 DIAGNOSIS — C50912 Malignant neoplasm of unspecified site of left female breast: Secondary | ICD-10-CM | POA: Diagnosis not present

## 2020-08-26 DIAGNOSIS — C50912 Malignant neoplasm of unspecified site of left female breast: Secondary | ICD-10-CM | POA: Diagnosis not present

## 2020-08-27 DIAGNOSIS — C50912 Malignant neoplasm of unspecified site of left female breast: Secondary | ICD-10-CM | POA: Diagnosis not present

## 2020-08-28 DIAGNOSIS — C50912 Malignant neoplasm of unspecified site of left female breast: Secondary | ICD-10-CM | POA: Diagnosis not present

## 2020-08-31 DIAGNOSIS — C50912 Malignant neoplasm of unspecified site of left female breast: Secondary | ICD-10-CM | POA: Diagnosis not present

## 2020-09-01 DIAGNOSIS — C50912 Malignant neoplasm of unspecified site of left female breast: Secondary | ICD-10-CM | POA: Diagnosis not present

## 2020-09-02 DIAGNOSIS — C50912 Malignant neoplasm of unspecified site of left female breast: Secondary | ICD-10-CM | POA: Diagnosis not present

## 2020-09-03 DIAGNOSIS — C50912 Malignant neoplasm of unspecified site of left female breast: Secondary | ICD-10-CM | POA: Diagnosis not present

## 2020-09-04 DIAGNOSIS — Z170 Estrogen receptor positive status [ER+]: Secondary | ICD-10-CM | POA: Diagnosis not present

## 2020-09-04 DIAGNOSIS — C50912 Malignant neoplasm of unspecified site of left female breast: Secondary | ICD-10-CM | POA: Diagnosis not present

## 2020-09-07 DIAGNOSIS — C50912 Malignant neoplasm of unspecified site of left female breast: Secondary | ICD-10-CM | POA: Diagnosis not present

## 2020-09-08 DIAGNOSIS — C50912 Malignant neoplasm of unspecified site of left female breast: Secondary | ICD-10-CM | POA: Diagnosis not present

## 2020-09-09 DIAGNOSIS — C50912 Malignant neoplasm of unspecified site of left female breast: Secondary | ICD-10-CM | POA: Diagnosis not present

## 2020-09-16 ENCOUNTER — Telehealth: Payer: Self-pay | Admitting: Family Medicine

## 2020-09-16 NOTE — Telephone Encounter (Signed)
I called the patient. Scheduled a lab visit for 09/18/20 at 8:15.

## 2020-09-16 NOTE — Telephone Encounter (Signed)
Patient called needing to schedule an appointment to get lab work done. The number to contact patient is [phone]

## 2020-09-18 ENCOUNTER — Ambulatory Visit: Payer: Medicare PPO

## 2020-09-23 ENCOUNTER — Other Ambulatory Visit: Payer: Self-pay

## 2020-09-23 ENCOUNTER — Ambulatory Visit (INDEPENDENT_AMBULATORY_CARE_PROVIDER_SITE_OTHER): Payer: Medicare PPO

## 2020-09-23 DIAGNOSIS — D721 Eosinophilia, unspecified: Secondary | ICD-10-CM

## 2020-09-23 DIAGNOSIS — R739 Hyperglycemia, unspecified: Secondary | ICD-10-CM

## 2020-09-23 DIAGNOSIS — R7989 Other specified abnormal findings of blood chemistry: Secondary | ICD-10-CM | POA: Diagnosis not present

## 2020-09-23 DIAGNOSIS — R946 Abnormal results of thyroid function studies: Secondary | ICD-10-CM | POA: Diagnosis not present

## 2020-09-23 DIAGNOSIS — R197 Diarrhea, unspecified: Secondary | ICD-10-CM

## 2020-09-23 NOTE — Progress Notes (Signed)
Fasting labs drawn per Dr. Junius Roads: BMET, HgbA1C, thyroid panel + TSH, CBC/diff and hepatic function panel.   The patient is still having issues with chronic diarrhea and wanted to come in for an office visit. Appointment scheduled for 09/28/20 at 9:00 am with Dr. Junius Roads.

## 2020-09-24 ENCOUNTER — Telehealth: Payer: Self-pay | Admitting: Family Medicine

## 2020-09-24 LAB — BASIC METABOLIC PANEL
BUN: 18 mg/dL (ref 7–25)
CO2: 25 mmol/L (ref 20–32)
Calcium: 9.4 mg/dL (ref 8.6–10.4)
Chloride: 97 mmol/L — ABNORMAL LOW (ref 98–110)
Creat: 0.77 mg/dL (ref 0.50–0.99)
Glucose, Bld: 87 mg/dL (ref 65–99)
Potassium: 3.7 mmol/L (ref 3.5–5.3)
Sodium: 133 mmol/L — ABNORMAL LOW (ref 135–146)

## 2020-09-24 LAB — CBC WITH DIFFERENTIAL/PLATELET
Absolute Monocytes: 734 cells/uL (ref 200–950)
Basophils Absolute: 38 cells/uL (ref 0–200)
Basophils Relative: 0.7 %
Eosinophils Absolute: 92 cells/uL (ref 15–500)
Eosinophils Relative: 1.7 %
HCT: 39.1 % (ref 35.0–45.0)
Hemoglobin: 12.3 g/dL (ref 11.7–15.5)
Lymphs Abs: 961 cells/uL (ref 850–3900)
MCH: 30.8 pg (ref 27.0–33.0)
MCHC: 31.5 g/dL — ABNORMAL LOW (ref 32.0–36.0)
MCV: 97.8 fL (ref 80.0–100.0)
MPV: 9.7 fL (ref 7.5–12.5)
Monocytes Relative: 13.6 %
Neutro Abs: 3575 cells/uL (ref 1500–7800)
Neutrophils Relative %: 66.2 %
Platelets: 271 10*3/uL (ref 140–400)
RBC: 4 10*6/uL (ref 3.80–5.10)
RDW: 14.4 % (ref 11.0–15.0)
Total Lymphocyte: 17.8 %
WBC: 5.4 10*3/uL (ref 3.8–10.8)

## 2020-09-24 LAB — HEMOGLOBIN A1C
Hgb A1c MFr Bld: 5.3 % of total Hgb (ref <5.7)
Mean Plasma Glucose: 105 mg/dL
eAG (mmol/L): 5.8 mmol/L

## 2020-09-24 LAB — HEPATIC FUNCTION PANEL
AG Ratio: 2 (calc) (ref 1.0–2.5)
ALT: 17 U/L (ref 6–29)
AST: 22 U/L (ref 10–35)
Albumin: 4.3 g/dL (ref 3.6–5.1)
Alkaline phosphatase (APISO): 67 U/L (ref 37–153)
Bilirubin, Direct: 0.1 mg/dL (ref 0.0–0.2)
Globulin: 2.2 g/dL (calc) (ref 1.9–3.7)
Indirect Bilirubin: 0.4 mg/dL (calc) (ref 0.2–1.2)
Total Bilirubin: 0.5 mg/dL (ref 0.2–1.2)
Total Protein: 6.5 g/dL (ref 6.1–8.1)

## 2020-09-24 LAB — THYROID PANEL WITH TSH
Free Thyroxine Index: 6.6 — ABNORMAL HIGH (ref 1.4–3.8)
T3 Uptake: 38 % — ABNORMAL HIGH (ref 22–35)
T4, Total: 17.4 ug/dL — ABNORMAL HIGH (ref 5.1–11.9)
TSH: 0.02 mIU/L — ABNORMAL LOW (ref 0.40–4.50)

## 2020-09-24 MED ORDER — LEVOTHYROXINE SODIUM 25 MCG PO TABS
25.0000 ug | ORAL_TABLET | Freq: Every day | ORAL | 6 refills | Status: DC
Start: 2020-09-24 — End: 2020-10-28

## 2020-09-24 NOTE — Telephone Encounter (Signed)
Labs are notable for the following:  Liver enzymes are normal.  Sodium level is slightly low.  No changes needed right now, but should recheck in 3 to 4 weeks.  Hemoglobin A1c looks great.  Thyroid numbers suggest overmedication.  I would suggest decreasing dosage to 25 mcg daily and rechecking in 8 to 12 weeks.  CBC now looks good.

## 2020-09-28 ENCOUNTER — Ambulatory Visit: Payer: Medicare PPO | Admitting: Family Medicine

## 2020-10-05 ENCOUNTER — Encounter: Payer: Self-pay | Admitting: Family Medicine

## 2020-10-08 DIAGNOSIS — C50412 Malignant neoplasm of upper-outer quadrant of left female breast: Secondary | ICD-10-CM | POA: Diagnosis not present

## 2020-10-08 DIAGNOSIS — Z170 Estrogen receptor positive status [ER+]: Secondary | ICD-10-CM | POA: Diagnosis not present

## 2020-10-12 ENCOUNTER — Encounter: Payer: Self-pay | Admitting: Family Medicine

## 2020-10-12 DIAGNOSIS — Z780 Asymptomatic menopausal state: Secondary | ICD-10-CM

## 2020-10-28 ENCOUNTER — Encounter: Payer: Self-pay | Admitting: Family Medicine

## 2020-10-28 ENCOUNTER — Ambulatory Visit (INDEPENDENT_AMBULATORY_CARE_PROVIDER_SITE_OTHER): Payer: Medicare PPO | Admitting: Family Medicine

## 2020-10-28 ENCOUNTER — Other Ambulatory Visit: Payer: Self-pay

## 2020-10-28 VITALS — BP 98/62 | HR 69 | Ht 64.0 in | Wt 121.4 lb

## 2020-10-28 DIAGNOSIS — R140 Abdominal distension (gaseous): Secondary | ICD-10-CM | POA: Diagnosis not present

## 2020-10-28 DIAGNOSIS — R197 Diarrhea, unspecified: Secondary | ICD-10-CM | POA: Diagnosis not present

## 2020-10-28 DIAGNOSIS — R109 Unspecified abdominal pain: Secondary | ICD-10-CM

## 2020-10-28 NOTE — Progress Notes (Signed)
Office Visit Note   Patient: Sheena White           Date of Birth: [birth year]           MRN: [id number] Visit Date: 10/28/2020 Requested by: Eunice Blase, MD [address] [place],  [place] [address] PCP: Eunice Blase, MD  Subjective: Chief Complaint  Patient presents with   Other    Stomach issues    HPI: She is here she is here with persistent diarrhea and abdominal pain.  Lowering dosage of Synthroid has not made any difference.  She has tried stopping supplements but that did not help.  She gets intermittent bloating and cramping, and diarrhea on a regular basis.  She has not been losing weight unintentionally.  No fevers or chills.  Dietary changes did not help.                ROS:   All other systems were reviewed and are negative.  Objective: Vital Signs: BP 98/62 (BP Location: Right Arm, Patient Position: Sitting, Cuff Size: Normal)   Pulse 69   Ht 5\' 4"  (1.626 m)   Wt 121 lb 6.4 oz (55.1 kg)   BMI 20.84 kg/m   Physical Exam:  General:  Alert and oriented, in no acute distress. Pulm:  Breathing unlabored. Psy:  Normal mood, congruent affect. Skin: No rash Abd: Bowel sounds are active, no hepatosplenomegaly or masses.  Soft and nontender.  No audible bruits.  No evidence of ascites.  Imaging: No results found.  Assessment & Plan: Persistent abdominal pain and diarrhea, possibilities include dysbiosis, SIBO - We will order Genova stool and SIBO testing and treat accordingly.     Procedures: No procedures performed        PMFS History: Patient Active Problem List   Diagnosis Date Noted   Malignant neoplasm of upper-outer quadrant of left breast in female, estrogen receptor positive ([place]) 04/13/2020   Past Medical History:  Diagnosis Date   Anxiety    Arthritis    mild   Breast cancer ([place])    Complication of anesthesia    very easily sedate   GERD (gastroesophageal reflux disease)    PONV (postoperative nausea and vomiting)     Psoriasis    Raynaud's disease    Skin cancer 2000    Family History  Problem Relation Age of Onset   Early death Mother    Stroke Mother    Early death Father    Heart disease Father    Colon cancer Maternal Aunt    Alcohol abuse Brother    Drug abuse Brother    Varicose Veins Maternal Grandmother     Past Surgical History:  Procedure Laterality Date   BREAST LUMPECTOMY WITH RADIOACTIVE SEED AND SENTINEL LYMPH NODE BIOPSY Left 06/03/2020   Procedure: LEFT BREAST LUMPECTOMY WITH RADIOACTIVE SEED AND SENTINEL LYMPH NODE BIOPSY;  Surgeon: Jovita Kussmaul, MD;  Location: [facility];  Service: General;  Laterality: Left;   CATARACT EXTRACTION     EYE SURGERY  02/23/2018   cataract   SHOULDER ACROMIOPLASTY     TONSILLECTOMY     Social History   Occupational History   Not on file  Tobacco Use   Smoking status: Never   Smokeless tobacco: Never  Substance and Sexual Activity   Alcohol use: Yes    Alcohol/week: 2.0 standard drinks    Types: 2 Glasses of wine per week   Drug use: Never   Sexual activity: Not  Currently

## 2020-10-30 ENCOUNTER — Other Ambulatory Visit: Payer: Self-pay | Admitting: Family Medicine

## 2020-10-30 DIAGNOSIS — E2839 Other primary ovarian failure: Secondary | ICD-10-CM

## 2020-11-04 ENCOUNTER — Other Ambulatory Visit: Payer: Self-pay

## 2020-11-04 ENCOUNTER — Ambulatory Visit
Admission: RE | Admit: 2020-11-04 | Discharge: 2020-11-04 | Disposition: A | Discharge status: Home / Self Care | Payer: Medicare PPO | Source: Ambulatory Visit | Attending: Family Medicine | Admitting: Family Medicine

## 2020-11-04 DIAGNOSIS — M810 Age-related osteoporosis without current pathological fracture: Secondary | ICD-10-CM | POA: Diagnosis not present

## 2020-11-04 DIAGNOSIS — E2839 Other primary ovarian failure: Secondary | ICD-10-CM

## 2020-11-04 DIAGNOSIS — Z780 Asymptomatic menopausal state: Secondary | ICD-10-CM | POA: Diagnosis not present

## 2020-11-06 ENCOUNTER — Telehealth: Payer: Self-pay | Admitting: Family Medicine

## 2020-11-06 NOTE — Telephone Encounter (Signed)
Bone density test shows osteoporosis in the lumbar spine and the hips.

## 2020-11-08 DIAGNOSIS — R141 Gas pain: Secondary | ICD-10-CM | POA: Diagnosis not present

## 2020-11-08 DIAGNOSIS — K580 Irritable bowel syndrome with diarrhea: Secondary | ICD-10-CM | POA: Diagnosis not present

## 2020-11-08 DIAGNOSIS — R1084 Generalized abdominal pain: Secondary | ICD-10-CM | POA: Diagnosis not present

## 2020-11-08 DIAGNOSIS — R140 Abdominal distension (gaseous): Secondary | ICD-10-CM | POA: Diagnosis not present

## 2020-11-11 DIAGNOSIS — R140 Abdominal distension (gaseous): Secondary | ICD-10-CM | POA: Diagnosis not present

## 2020-11-11 DIAGNOSIS — R109 Unspecified abdominal pain: Secondary | ICD-10-CM | POA: Diagnosis not present

## 2020-11-11 DIAGNOSIS — R141 Gas pain: Secondary | ICD-10-CM | POA: Diagnosis not present

## 2020-11-11 DIAGNOSIS — R197 Diarrhea, unspecified: Secondary | ICD-10-CM | POA: Diagnosis not present

## 2020-11-25 ENCOUNTER — Telehealth: Payer: Self-pay | Admitting: Family Medicine

## 2020-11-25 NOTE — Telephone Encounter (Signed)
Genova test shows:  - Decreased pancreatic elastase and fecal fats.  - Decreased beta glucuronidase.  - Slight dysbiosis.

## 2020-12-10 DIAGNOSIS — Z170 Estrogen receptor positive status [ER+]: Secondary | ICD-10-CM | POA: Diagnosis not present

## 2020-12-10 DIAGNOSIS — C50412 Malignant neoplasm of upper-outer quadrant of left female breast: Secondary | ICD-10-CM | POA: Diagnosis not present

## 2020-12-11 ENCOUNTER — Encounter: Payer: Self-pay | Admitting: Family Medicine

## 2020-12-11 MED ORDER — SIMVASTATIN 10 MG PO TABS: 10.0000 mg | ORAL_TABLET | Freq: Every day | ORAL | 3 refills | Status: DC

## 2021-01-26 ENCOUNTER — Telehealth: Payer: Self-pay | Admitting: Family Medicine

## 2021-01-26 NOTE — Telephone Encounter (Signed)
Request for records received from [facility]. Records emailed.

## 2021-03-30 NOTE — Progress Notes (Signed)
71 y.o. G43P2002 Married White or Caucasian Not Hispanic or Latino female here for annual exam.   The patient is being referred for evaluation of a thickened endometrial stripe. She denies any vaginal bleeding.   She has a h/o lobular breast cancer of the left breast, diagnosed in 2021. She is s/p lumpectomy/LSNB and prior tamoxifen therapy (for 16 days) and prior letrozole therapy. 03/10/21 note from her Oncologist was reviewed.   On 03/10/21, she had a CT scan to f/u on hepatic cysts. Results reviewed.  Impression:  1.  No definite evidence for metastatic disease.  2.  Nonspecific thickening of the endometrial complex, may be related to  tamoxifen related treatment change. Recommend pelvic ultrasound and  Gynecology consultation.   03/15/21 Ultrasound: Findings:  UTERUS:  The uterus is normal in appearance. It measures 4.1 x 6.4 x 3.2  cm. The endometrium heterogeneous and thickened with areas of cystic change  and measures 1.4 cm. Calcified fibroid measuring 0.6x 0.5 x 0.5 cm in the  uterine body.   RIGHT ADNEXA:  Right ovary not visualized on the images provided and reviewed.   LEFT ADNEXA:  Left ovary not visualized on the images provided and reviewed.   OTHER: No free fluid in the pelvis. The urinary bladder partially  decompressed.   Impression:  Heterogeneously thickened endometrial stripe measuring 1.4 cm with cystic  changes, which can be seen in the setting of tamoxifen use. The  differential includes endometrial hyperplasia or neoplasm. Recommend  attention on follow up and gynecologic consultation for further evaluation.    She was previously on letrozole, didn't tolerate it.   No LMP recorded. Patient is postmenopausal.          Sexually active: No.  The current method of family planning is post menopausal status.    Exercising: Yes.     Walk/yoga Smoker:  no  Health Maintenance: Pap: age [age]-WNL per pt  History of abnormal Pap:  no MMG:  03-27-20 Left  breast mass  04-03-20 Unilateral mammo left-suspicious mass--Biopsy-metastatic carcinoma BMD:   11-04-20 Osteoporosis, T score -3.1 Colonoscopy: Unsure/5-6years ago- WNL per pt, Has had cologard and FOBs since-WNL per pt TDaP: UTD Gardasil: no    reports that she has never smoked. She has never used smokeless tobacco. She reports current alcohol use of about 1.0 standard drink per week. She reports that she does not use drugs. 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 She has 2 daughters and 4 grandchildren.   Past Medical History:  Diagnosis Date   Anxiety    Arthritis    mild   Breast cancer ([place])    Complication of anesthesia    very easily sedate   GERD (gastroesophageal reflux disease)    PONV (postoperative nausea and vomiting)    Psoriasis    Raynaud's disease    Skin cancer 2000    Past Surgical History:  Procedure Laterality Date   BREAST LUMPECTOMY WITH RADIOACTIVE SEED AND SENTINEL LYMPH NODE BIOPSY Left 06/03/2020   Procedure: LEFT BREAST LUMPECTOMY WITH RADIOACTIVE SEED AND SENTINEL LYMPH NODE BIOPSY;  Surgeon: Jovita Kussmaul, MD;  Location: [facility];  Service: General;  Laterality: Left;   CATARACT EXTRACTION     EYE SURGERY  02/23/2018   cataract   SHOULDER ACROMIOPLASTY     TONSILLECTOMY      Current Outpatient Medications  Medication Sig Dispense Refill   calcipotriene-betamethasone (TACLONEX SCALP) external suspension      clobetasol (TEMOVATE) 0.05 %  external solution      famotidine (PEPCID) 10 MG tablet      loratadine (CLARITIN) 10 MG tablet Take 10 mg by mouth daily as needed for allergies.     misoprostol (CYTOTEC) 200 MCG tablet Place 2 tablets vaginally 6-12 hours prior to surgery 2 tablet 0   simvastatin (ZOCOR) 10 MG tablet Take 1 tablet (10 mg total) by mouth daily. 90 tablet 3   thyroid (NP THYROID) 15 MG tablet      No current facility-administered medications for this visit.    Family History  Problem Relation Age  of Onset   Early death Mother    Stroke Mother    Early death Father    Heart disease Father    Colon cancer Maternal Aunt    Alcohol abuse Brother    Drug abuse Brother    Varicose Veins Maternal Grandmother     Review of Systems  Exam:   BP 128/82   Pulse 68   Ht 5' 3.5" (1.613 m)   Wt 122 lb (55.3 kg)   SpO2 99%   BMI 21.27 kg/m   Weight change: @WEIGHTCHANGE @ Height:   Height: 5' 3.5" (161.3 cm)  Ht Readings from Last 3 Encounters:  04/09/21 5' 3.5" (1.613 m)  10/28/20 5\' 4"  (1.626 m)  07/22/20 5\' 4"  (1.626 m)    General appearance: alert, cooperative and appears stated age Head: Normocephalic, without obvious abnormality, atraumatic Neck: no adenopathy, supple, symmetrical, trachea midline and thyroid normal to inspection and palpation Heart: regular rate and rhythm Lungs: CTAB Abdomen: soft, non-tender; bowel sounds normal; no masses,  no organomegaly Extremities: normal, atraumatic, no cyanosis Skin: normal color, texture and turgor, no rashes or lesions Lymph: normal cervical supraclavicular and inguinal nodes Neurologic: grossly normal    Pelvic: External genitalia:  no lesions              Urethra:  normal appearing urethra with no masses, tenderness or lesions              Bartholins and Skenes: normal                 Vagina: normal appearing vagina with normal color and discharge, no lesions              Cervix: no lesions and flush with vagina and stenotic, difficult to get the cytobrush in her cervix.               Bimanual Exam:  Uterus:  normal size, contour, position, consistency, mobility, non-tender              Adnexa: no mass, fullness, tenderness               Rectovaginal: Confirms               Anus:  normal sphincter tone, no lesions  Lovena Le, CMA chaperoned for the exam.   1. Thickened endometrium The patient has a stenotic cervix. We discussed the options of pretreating her with cytotec and having her return for an attempt at  endometrial biopsy vs hysteroscopy, D&C. We discussed that if she has an endometrial polyp, it can be removed at the time of hysteroscopy. She would prefer to have the hysteroscopy, D&C. Plan: hysteroscopy,  dilation and curettage with ultrasound guidance. Reviewed risks, including: bleeding, infection, uterine perforation. -Will pretreat with cytotec  2. Stricture and stenosis of cervix - misoprostol (CYTOTEC) 200 MCG tablet; Place 2 tablets vaginally 6-12 hours prior to  surgery  Dispense: 2 tablet; Refill: 0  3. Screening for cervical cancer - Cytology - PAP

## 2021-04-07 ENCOUNTER — Ambulatory Visit: Payer: Self-pay | Admitting: Obstetrics and Gynecology

## 2021-04-09 ENCOUNTER — Telehealth: Payer: Self-pay | Admitting: Obstetrics and Gynecology

## 2021-04-09 ENCOUNTER — Ambulatory Visit: Payer: Medicare PPO | Admitting: Obstetrics and Gynecology

## 2021-04-09 ENCOUNTER — Other Ambulatory Visit: Payer: Self-pay

## 2021-04-09 ENCOUNTER — Other Ambulatory Visit (HOSPITAL_COMMUNITY)
Admission: RE | Admit: 2021-04-09 | Discharge: 2021-04-09 | Disposition: A | Discharge status: Home / Self Care | Payer: Medicare PPO | Source: Ambulatory Visit | Attending: Obstetrics and Gynecology | Admitting: Obstetrics and Gynecology

## 2021-04-09 ENCOUNTER — Encounter: Payer: Self-pay | Admitting: Obstetrics and Gynecology

## 2021-04-09 VITALS — BP 128/82 | HR 68 | Ht 63.5 in | Wt 122.0 lb

## 2021-04-09 DIAGNOSIS — Z124 Encounter for screening for malignant neoplasm of cervix: Secondary | ICD-10-CM | POA: Diagnosis not present

## 2021-04-09 DIAGNOSIS — E039 Hypothyroidism, unspecified: Secondary | ICD-10-CM | POA: Insufficient documentation

## 2021-04-09 DIAGNOSIS — R9389 Abnormal findings on diagnostic imaging of other specified body structures: Secondary | ICD-10-CM | POA: Diagnosis not present

## 2021-04-09 DIAGNOSIS — N882 Stricture and stenosis of cervix uteri: Secondary | ICD-10-CM | POA: Diagnosis not present

## 2021-04-09 MED ORDER — MISOPROSTOL 200 MCG PO TABS: ORAL_TABLET | ORAL | 0 refills | Status: DC

## 2021-04-09 NOTE — Telephone Encounter (Signed)
Surgery: Hysteroscopy, D&C with myosure under ultrasound guidance  Diagnosis: Thickened endometrium, stenotic cervix  Location: [facility]  Status: Outpatient  Time: 15 Minutes  Assistant: N/A  Urgency: First Available  Pre-Op Appointment: Completed  Post-Op Appointment(s): 1 Week,   Time Out Of Work: Day Of Surgery ONLY

## 2021-04-13 LAB — CYTOLOGY - PAP: Diagnosis: NEGATIVE

## 2021-04-13 NOTE — Telephone Encounter (Signed)
Left message to call Joana Nolton, RN at [facility], [phone].  

## 2021-04-14 ENCOUNTER — Other Ambulatory Visit: Payer: Self-pay | Admitting: Obstetrics and Gynecology

## 2021-04-14 ENCOUNTER — Other Ambulatory Visit (HOSPITAL_COMMUNITY): Payer: Self-pay | Admitting: Obstetrics and Gynecology

## 2021-04-14 DIAGNOSIS — R9389 Abnormal findings on diagnostic imaging of other specified body structures: Secondary | ICD-10-CM

## 2021-04-14 NOTE — Telephone Encounter (Signed)
Spoke with patient. Reviewed surgery dates. Patient request to proceed with surgery on 05/10/21.  Advised patient I will forward to business office for return call after 04/26/21. I will return call once surgery date and time confirmed. Patient verbalizes understanding and is agreeable.   Surgery request sent.

## 2021-04-20 NOTE — Telephone Encounter (Signed)
Spoke with patient. Surgery date request confirmed.  Advised surgery is scheduled for 05/10/21, [facility] at 1145.  Surgery instruction sheet and hospital brochure reviewed, printed copy will be mailed.  Patient advised if Covid screening and quarantine requirements and agreeable.   Routing to [employer] to review benefits after 04/26/21.

## 2021-04-27 ENCOUNTER — Other Ambulatory Visit (HOSPITAL_COMMUNITY): Payer: Self-pay | Admitting: Obstetrics and Gynecology

## 2021-04-27 ENCOUNTER — Telehealth: Payer: Self-pay | Admitting: *Deleted

## 2021-04-27 ENCOUNTER — Other Ambulatory Visit: Payer: Self-pay | Admitting: Obstetrics and Gynecology

## 2021-04-27 DIAGNOSIS — R9389 Abnormal findings on diagnostic imaging of other specified body structures: Secondary | ICD-10-CM

## 2021-04-27 NOTE — Telephone Encounter (Signed)
Spoke with patient.  Notified of surgery time change for 05/10/21, now 0730 with arrival of 0530 at [facility].  Patient request msg via MyChart, msg sent.   Patient verbalizes understanding and is agreeable.   Encounter closed.

## 2021-05-04 ENCOUNTER — Other Ambulatory Visit: Payer: Self-pay

## 2021-05-04 ENCOUNTER — Encounter (HOSPITAL_BASED_OUTPATIENT_CLINIC_OR_DEPARTMENT_OTHER): Payer: Self-pay | Admitting: Obstetrics and Gynecology

## 2021-05-04 NOTE — Progress Notes (Signed)
Spoke w/ via phone for pre-op interview--- pt Lab needs dos----  no             Lab results------ no COVID test -----patient states asymptomatic no test needed Arrive at ------- 0530 on 05-10-2021 NPO after MN NO Solid Food.  Clear liquids from MN until--- 0430 Med rec completed Medications to take morning of surgery ----- np thyroid, claritin, pepcid Diabetic medication ----- n/a Patient instructed no nail polish to be worn day of surgery Patient instructed to bring photo id and insurance card day of surgery Patient aware to have Driver (ride ) / caregiver for 24 hours after surgery -- husband, don Patient Special Instructions ----- n/a Pre-Op special Istructions ----- n/a Patient verbalized understanding of instructions that were given at this phone interview. Patient denies shortness of breath, chest pain, fever, cough at this phone interview.

## 2021-05-10 ENCOUNTER — Ambulatory Visit (HOSPITAL_BASED_OUTPATIENT_CLINIC_OR_DEPARTMENT_OTHER)
Admission: RE | Admit: 2021-05-10 | Discharge: 2021-05-10 | Disposition: A | Discharge status: Home / Self Care | Payer: Medicare PPO | Attending: Obstetrics and Gynecology | Admitting: Obstetrics and Gynecology

## 2021-05-10 ENCOUNTER — Encounter (HOSPITAL_BASED_OUTPATIENT_CLINIC_OR_DEPARTMENT_OTHER): Payer: Self-pay | Admitting: Obstetrics and Gynecology

## 2021-05-10 ENCOUNTER — Ambulatory Visit (HOSPITAL_BASED_OUTPATIENT_CLINIC_OR_DEPARTMENT_OTHER): Payer: Medicare PPO | Admitting: Anesthesiology

## 2021-05-10 ENCOUNTER — Ambulatory Visit (HOSPITAL_COMMUNITY)
Admission: RE | Admit: 2021-05-10 | Discharge: 2021-05-10 | Disposition: A | Discharge status: Home / Self Care | Payer: Medicare PPO | Source: Ambulatory Visit | Attending: Obstetrics and Gynecology | Admitting: Obstetrics and Gynecology

## 2021-05-10 ENCOUNTER — Other Ambulatory Visit: Payer: Self-pay

## 2021-05-10 ENCOUNTER — Encounter (HOSPITAL_BASED_OUTPATIENT_CLINIC_OR_DEPARTMENT_OTHER)
Admission: RE | Disposition: A | Discharge status: Home / Self Care | Payer: Self-pay | Source: Home / Self Care | Attending: Obstetrics and Gynecology

## 2021-05-10 DIAGNOSIS — N840 Polyp of corpus uteri: Secondary | ICD-10-CM

## 2021-05-10 DIAGNOSIS — Z79899 Other long term (current) drug therapy: Secondary | ICD-10-CM | POA: Diagnosis not present

## 2021-05-10 DIAGNOSIS — K219 Gastro-esophageal reflux disease without esophagitis: Secondary | ICD-10-CM | POA: Diagnosis not present

## 2021-05-10 DIAGNOSIS — R9389 Abnormal findings on diagnostic imaging of other specified body structures: Secondary | ICD-10-CM

## 2021-05-10 DIAGNOSIS — E039 Hypothyroidism, unspecified: Secondary | ICD-10-CM | POA: Diagnosis not present

## 2021-05-10 DIAGNOSIS — F419 Anxiety disorder, unspecified: Secondary | ICD-10-CM | POA: Diagnosis not present

## 2021-05-10 DIAGNOSIS — N882 Stricture and stenosis of cervix uteri: Secondary | ICD-10-CM | POA: Insufficient documentation

## 2021-05-10 HISTORY — PX: OPERATIVE ULTRASOUND: SHX5996

## 2021-05-10 HISTORY — PX: DILATATION & CURETTAGE/HYSTEROSCOPY WITH MYOSURE: SHX6511

## 2021-05-10 HISTORY — DX: Abnormal findings on diagnostic imaging of other specified body structures: R93.89

## 2021-05-10 HISTORY — DX: Stricture and stenosis of cervix uteri: N88.2

## 2021-05-10 HISTORY — DX: Hypothyroidism, unspecified: E03.9

## 2021-05-10 HISTORY — DX: Personal history of irradiation: Z92.3

## 2021-05-10 SURGERY — DILATATION & CURETTAGE/HYSTEROSCOPY WITH MYOSURE
Anesthesia: General | Site: Uterus

## 2021-05-10 MED ORDER — PROPOFOL 10 MG/ML IV BOLUS
INTRAVENOUS | Status: DC | PRN
  Administered 2021-05-10: 200 mg via INTRAVENOUS
  Administered 2021-05-10: 100 mg via INTRAVENOUS

## 2021-05-10 MED ORDER — LACTATED RINGERS IV SOLN: INTRAVENOUS | Status: DC

## 2021-05-10 MED ORDER — ONDANSETRON HCL 4 MG/2ML IJ SOLN
INTRAMUSCULAR | Status: DC | PRN
  Administered 2021-05-10: 4 mg via INTRAVENOUS

## 2021-05-10 MED ORDER — FENTANYL CITRATE (PF) 100 MCG/2ML IJ SOLN
INTRAMUSCULAR | Status: AC
  Filled 2021-05-10: qty 2

## 2021-05-10 MED ORDER — FENTANYL CITRATE (PF) 100 MCG/2ML IJ SOLN: 25.0000 ug | INTRAMUSCULAR | Status: DC | PRN

## 2021-05-10 MED ORDER — EPHEDRINE SULFATE 50 MG/ML IJ SOLN
INTRAMUSCULAR | Status: DC | PRN
Start: 2021-05-10 — End: 2021-05-10
  Administered 2021-05-10: 15 mg via INTRAVENOUS

## 2021-05-10 MED ORDER — DEXAMETHASONE SODIUM PHOSPHATE 10 MG/ML IJ SOLN
INTRAMUSCULAR | Status: AC
  Filled 2021-05-10: qty 1

## 2021-05-10 MED ORDER — ACETAMINOPHEN 10 MG/ML IV SOLN: 1000.0000 mg | Freq: Once | INTRAVENOUS | Status: DC | PRN

## 2021-05-10 MED ORDER — PROPOFOL 10 MG/ML IV BOLUS
INTRAVENOUS | Status: AC
  Filled 2021-05-10: qty 20

## 2021-05-10 MED ORDER — OXYCODONE HCL 5 MG PO TABS: 5.0000 mg | ORAL_TABLET | Freq: Once | ORAL | Status: DC | PRN

## 2021-05-10 MED ORDER — ACETAMINOPHEN 500 MG PO TABS: 1000.0000 mg | ORAL_TABLET | Freq: Once | ORAL | Status: DC

## 2021-05-10 MED ORDER — DEXAMETHASONE SODIUM PHOSPHATE 4 MG/ML IJ SOLN
INTRAMUSCULAR | Status: DC | PRN
  Administered 2021-05-10: 10 mg via INTRAVENOUS

## 2021-05-10 MED ORDER — OXYCODONE HCL 5 MG/5ML PO SOLN: 5.0000 mg | Freq: Once | ORAL | Status: DC | PRN

## 2021-05-10 MED ORDER — MIDAZOLAM HCL 5 MG/5ML IJ SOLN
INTRAMUSCULAR | Status: DC | PRN
  Administered 2021-05-10: 2 mg via INTRAVENOUS

## 2021-05-10 MED ORDER — FENTANYL CITRATE (PF) 100 MCG/2ML IJ SOLN
INTRAMUSCULAR | Status: DC | PRN
  Administered 2021-05-10 (×4): 25 ug via INTRAVENOUS

## 2021-05-10 MED ORDER — SODIUM CHLORIDE 0.9 % IV SOLN
INTRAVENOUS | Status: AC | PRN
  Administered 2021-05-10: 3000 mL

## 2021-05-10 MED ORDER — KETOROLAC TROMETHAMINE 30 MG/ML IJ SOLN
INTRAMUSCULAR | Status: AC
  Filled 2021-05-10: qty 1

## 2021-05-10 MED ORDER — ACETAMINOPHEN 500 MG PO TABS: 1000.0000 mg | ORAL_TABLET | Freq: Once | ORAL | Status: DC | PRN

## 2021-05-10 MED ORDER — LIDOCAINE 2% (20 MG/ML) 5 ML SYRINGE
INTRAMUSCULAR | Status: DC | PRN
  Administered 2021-05-10: 60 mg via INTRAVENOUS

## 2021-05-10 MED ORDER — ONDANSETRON HCL 4 MG/2ML IJ SOLN
INTRAMUSCULAR | Status: AC
  Filled 2021-05-10: qty 2

## 2021-05-10 MED ORDER — EPHEDRINE 5 MG/ML INJ
INTRAVENOUS | Status: AC
  Filled 2021-05-10: qty 5

## 2021-05-10 MED ORDER — ACETAMINOPHEN 160 MG/5ML PO SOLN: 1000.0000 mg | Freq: Once | ORAL | Status: DC | PRN

## 2021-05-10 MED ORDER — LIDOCAINE HCL (PF) 2 % IJ SOLN
INTRAMUSCULAR | Status: AC
  Filled 2021-05-10: qty 5

## 2021-05-10 MED ORDER — KETOROLAC TROMETHAMINE 30 MG/ML IJ SOLN
INTRAMUSCULAR | Status: DC | PRN
  Administered 2021-05-10: 30 mg via INTRAVENOUS

## 2021-05-10 MED ORDER — MIDAZOLAM HCL 2 MG/2ML IJ SOLN
INTRAMUSCULAR | Status: AC
  Filled 2021-05-10: qty 2

## 2021-05-10 SURGICAL SUPPLY — 22 items
CATH ROBINSON RED A/P 16FR (CATHETERS) IMPLANT
DEVICE MYOSURE LITE (MISCELLANEOUS) IMPLANT
DEVICE MYOSURE REACH (MISCELLANEOUS) ×1 IMPLANT
DILATOR CANAL MILEX (MISCELLANEOUS) IMPLANT
DRSG TELFA 3X8 NADH (GAUZE/BANDAGES/DRESSINGS) ×2 IMPLANT
GAUZE 4X4 16PLY ~~LOC~~+RFID DBL (SPONGE) ×6 IMPLANT
GLOVE SURG ENC MOIS LTX SZ6.5 (GLOVE) ×3 IMPLANT
GOWN STRL REUS W/TWL LRG LVL3 (GOWN DISPOSABLE) ×3 IMPLANT
IV NS IRRIG 3000ML ARTHROMATIC (IV SOLUTION) ×3 IMPLANT
KIT PROCEDURE FLUENT (KITS) ×3 IMPLANT
KIT TURNOVER CYSTO (KITS) ×3 IMPLANT
MYOSURE XL FIBROID (MISCELLANEOUS)
PACK VAGINAL MINOR WOMEN LF (CUSTOM PROCEDURE TRAY) ×3 IMPLANT
PAD DRESSING TELFA 3X8 NADH (GAUZE/BANDAGES/DRESSINGS) ×2 IMPLANT
PAD OB MATERNITY 4.3X12.25 (PERSONAL CARE ITEMS) ×3 IMPLANT
PAD PREP 24X48 CUFFED NSTRL (MISCELLANEOUS) ×3 IMPLANT
SEAL CERVICAL OMNI LOK (ABLATOR) IMPLANT
SEAL ROD LENS SCOPE MYOSURE (ABLATOR) ×3 IMPLANT
SYR 20ML LL LF (SYRINGE) IMPLANT
SYSTEM TISS REMOVAL MYOSURE XL (MISCELLANEOUS) IMPLANT
TOWEL OR 17X26 10 PK STRL BLUE (TOWEL DISPOSABLE) ×3 IMPLANT
WATER STERILE IRR 500ML POUR (IV SOLUTION) IMPLANT

## 2021-05-10 NOTE — Op Note (Signed)
Preoperative Diagnosis: Thickened endometrium, stenotic cervix   Postoperative Diagnosis: Endometrial polyp, stenotic cervix  Procedure: Hysteroscopy, polypectomy, dilation and curettage under ultrasound guidance   Surgeon: Dr Sumner Boast  Assistants: None  Anesthesia: General via LMA  EBL: 5 cc  Fluids: 900 cc LR  Fluid deficit: 135 cc  Urine output: not recorded  Indications for surgery: The patient is a 71 yo female, who presented with an incidental finding of a thickened endometrial stripe. She was noted to have cervical stenosis on exam.  The risks of the surgery were reviewed with the patient and the consent form was signed prior to her surgery. She was pretreated with cytotec.   Findings: Small, anteverted uterus, no adnexal masses. Hysteroscopy: large endometrial polyp, 2 small areas of slight endometrial thickening, otherwise thin endometrium. Normal tubal ostia bilaterally.   Specimens: Endometrial polyp, endometrial curetting's.    Procedure: The patient was taken to the operating room with an IV in place. She was placed in the dorsal lithotomy position and anesthesia was administered. She was prepped and draped in the usual sterile fashion for a vaginal procedure.  A weighted speculum was placed in the vagina and a single tooth tenaculum was placed on the anterior lip of the cervix. Using ultrasound guidance the cervix was dilated to a #6.5 hagar dilator. The uterus was sounded to 7 cm. The myosure hysteroscope was inserted into the uterine cavity. With continuous infusion of normal saline, the uterine cavity was visualized with the above findings. The myosure reach was used to resect the polyp and area's of endometrial thickening. The myosure was then removed. The cavity was then curetted with the small sharp curette. The cavity had the characteristically gritty texture at the end of the procedure. The curette and the single tooth tenaculum were removed. The speculum was  removed. The patients perineum was cleansed of betadine and she was taken out of the dorsal lithotomy position.  Upon awakening the LMA was removed and the patient was transferred to the recovery room in stable and awake condition.  The sponge and instrument count were correct. There were no complications.

## 2021-05-10 NOTE — Discharge Instructions (Addendum)
DISCHARGE INSTRUCTIONS: D&C / D&E The following instructions have been prepared to help you care for yourself upon your return home.   Personal hygiene:  Use sanitary pads for vaginal drainage, not tampons.  Shower the day after your procedure.  NO tub baths, pools or Jacuzzis for 2-3 weeks.  Wipe front to back after using the bathroom.  Activity and limitations:  Do NOT drive or operate any equipment for 24 hours. The effects of anesthesia are still present and drowsiness may result.  Do NOT rest in bed all day.  Walking is encouraged.  Walk up and down stairs slowly.  You may resume your normal activity in one to two days or as indicated by your physician.  Sexual activity: NO intercourse for at least 2 weeks after the procedure, or as indicated by your physician.  Diet: Eat a light meal as desired this evening. You may resume your usual diet tomorrow.  Return to work: You may resume your work activities in one to two days or as indicated by your doctor.  What to expect after your surgery: Expect to have vaginal bleeding/discharge for 2-3 days and spotting for up to 10 days. It is not unusual to have soreness for up to 1-2 weeks. You may have a slight burning sensation when you urinate for the first day. Mild cramps may continue for a couple of days. You may have a regular period in 2-6 weeks.  Call your doctor for any of the following:  Excessive vaginal bleeding, saturating and changing one pad every hour.  Inability to urinate 6 hours after discharge from hospital.  Pain not relieved by pain medication.  Fever of 100.4 F or greater.  Unusual vaginal discharge or odor.   Post Anesthesia Home Care Instructions  Activity: Get plenty of rest for the remainder of the day. A responsible individual must stay with you for 24 hours following the procedure.  For the next 24 hours, DO NOT: -Drive a car -Paediatric nurse -Drink alcoholic beverages -Take any medication unless  instructed by your physician -Make any legal decisions or sign important papers.  Meals: Start with liquid foods such as gelatin or soup. Progress to regular foods as tolerated. Avoid greasy, spicy, heavy foods. If nausea and/or vomiting occur, drink only clear liquids until the nausea and/or vomiting subsides. Call your physician if vomiting continues.  Special Instructions/Symptoms: Your throat may feel dry or sore from the anesthesia or the breathing tube placed in your throat during surgery. If this causes discomfort, gargle with warm salt water. The discomfort should disappear within 24 hours.  No ibuprofen, Advil, Aleve, Motrin, ketorolac, meloxicam, naproxen, or other NSAIDS until after 2:00 pm today if needed.

## 2021-05-10 NOTE — H&P (Signed)
Office Visit 04/09/2021 [facility], Francesca Jewett, MD Obstetrics and Gynecology Thickened endometrium +2 more Dx Referred by Eunice Blase, MD Reason for Visit   Additional Documentation  Vitals:  BP 128/82 Pulse 68 Ht 5' 3.5" (1.613 m) Wt 55.3 kg SpO2 99% BMI 21.27 kg/m BSA 1.57 m  Flowsheets:  NEWS, MEWS Score, Anthropometrics, Method of Visit   Encounter Info:  Billing Info, History, Allergies, Detailed Report    All Notes    Progress Notes by Salvadore Dom, MD at 04/09/2021 10:00 AM  Author: Salvadore Dom, MD Author Type: Physician Filed: 04/09/2021  1:35 PM  Note Status: Signed Cosign: Cosign Not Required Encounter Date: 04/09/2021  Editor: Salvadore Dom, MD (Physician)      Prior Versions: 1. Netta Corrigan, CMA (Certified Medical Assistant) at 04/09/2021 10:03 AM - Sign when Signing Visit   2. Salvadore Dom, MD (Physician) at 04/09/2021  9:42 AM - Sign when Signing Visit   3. Netta Corrigan, CMA (Certified Medical Assistant) at 04/09/2021  8:38 AM - Sign when Signing Visit   4. Nelva Nay, CMA (Certified Medical Assistant) at 03/30/2021  3:53 PM - Sign when Signing Visit    71 y.o. G2P2002 Married White or Caucasian Not Hispanic or Latino female here for annual exam.   The patient is being referred for evaluation of a thickened endometrial stripe. She denies any vaginal bleeding.    She has a h/o lobular breast cancer of the left breast, diagnosed in 2021. She is s/p lumpectomy/LSNB and prior tamoxifen therapy (for 16 days) and prior letrozole therapy. 03/10/21 note from her Oncologist was reviewed.    On 03/10/21, she had a CT scan to f/u on hepatic cysts. Results reviewed.  Impression:  1.  No definite evidence for metastatic disease.  2.  Nonspecific thickening of the endometrial complex, may be related to  tamoxifen related treatment change. Recommend pelvic ultrasound and   Gynecology consultation.    03/15/21 Ultrasound: Findings:  UTERUS:  The uterus is normal in appearance. It measures 4.1 x 6.4 x 3.2  cm. The endometrium heterogeneous and thickened with areas of cystic change  and measures 1.4 cm. Calcified fibroid measuring 0.6x 0.5 x 0.5 cm in the  uterine body.   RIGHT ADNEXA:  Right ovary not visualized on the images provided and reviewed.   LEFT ADNEXA:  Left ovary not visualized on the images provided and reviewed.   OTHER: No free fluid in the pelvis. The urinary bladder partially  decompressed.   Impression:  Heterogeneously thickened endometrial stripe measuring 1.4 cm with cystic  changes, which can be seen in the setting of tamoxifen use. The  differential includes endometrial hyperplasia or neoplasm. Recommend  attention on follow up and gynecologic consultation for further evaluation.  She was previously on letrozole, didn't tolerate it.    No LMP recorded. Patient is postmenopausal.          Sexually active: No.  The current method of family planning is post menopausal status.    Exercising: Yes.     Walk/yoga Smoker:  no   Health Maintenance: Pap: age [age]-WNL per pt  History of abnormal Pap:  no MMG:  03-27-20 Left breast mass  04-03-20 Unilateral mammo left-suspicious mass--Biopsy-metastatic carcinoma BMD:   11-04-20 Osteoporosis, T score -3.1 Colonoscopy: Unsure/5-6years ago- WNL per pt, Has had cologard and FOBs since-WNL per pt TDaP: UTD Gardasil: no     reports that she has never smoked.  She has never used smokeless tobacco. She reports current alcohol use of about 1.0 standard drink per week. She reports that she does not use drugs. 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 She has 2 daughters and 4 grandchildren.        Past Medical History:  Diagnosis Date   Anxiety     Arthritis      mild   Breast cancer ([place])     Complication of anesthesia      very easily sedate   GERD (gastroesophageal reflux  disease)     PONV (postoperative nausea and vomiting)     Psoriasis     Raynaud's disease     Skin cancer 2000           Past Surgical History:  Procedure Laterality Date   BREAST LUMPECTOMY WITH RADIOACTIVE SEED AND SENTINEL LYMPH NODE BIOPSY Left 06/03/2020    Procedure: LEFT BREAST LUMPECTOMY WITH RADIOACTIVE SEED AND SENTINEL LYMPH NODE BIOPSY;  Surgeon: Jovita Kussmaul, MD;  Location: [facility];  Service: General;  Laterality: Left;   CATARACT EXTRACTION       EYE SURGERY   02/23/2018    cataract   SHOULDER ACROMIOPLASTY       TONSILLECTOMY                Current Outpatient Medications  Medication Sig Dispense Refill   calcipotriene-betamethasone (TACLONEX SCALP) external suspension         clobetasol (TEMOVATE) 0.05 % external solution         famotidine (PEPCID) 10 MG tablet         loratadine (CLARITIN) 10 MG tablet Take 10 mg by mouth daily as needed for allergies.       misoprostol (CYTOTEC) 200 MCG tablet Place 2 tablets vaginally 6-12 hours prior to surgery 2 tablet 0   simvastatin (ZOCOR) 10 MG tablet Take 1 tablet (10 mg total) by mouth daily. 90 tablet 3   thyroid (NP THYROID) 15 MG tablet          No current facility-administered medications for this visit.           Family History  Problem Relation Age of Onset   Early death Mother     Stroke Mother     Early death Father     Heart disease Father     Colon cancer Maternal Aunt     Alcohol abuse Brother     Drug abuse Brother     Varicose Veins Maternal Grandmother        Review of Systems   Exam:   BP 128/82    Pulse 68    Ht 5' 3.5" (1.613 m)    Wt 122 lb (55.3 kg)    SpO2 99%    BMI 21.27 kg/m   Weight change: @WEIGHTCHANGE @ Height:   Height: 5' 3.5" (161.3 cm)     Ht Readings from Last 3 Encounters:  04/09/21 5' 3.5" (1.613 m)  10/28/20 5\' 4"  (1.626 m)  07/22/20 5\' 4"  (1.626 m)      General appearance: alert, cooperative and appears stated age Head: Normocephalic, without  obvious abnormality, atraumatic Neck: no adenopathy, supple, symmetrical, trachea midline and thyroid normal to inspection and palpation Heart: regular rate and rhythm Lungs: CTAB Abdomen: soft, non-tender; bowel sounds normal; no masses,  no organomegaly Extremities: normal, atraumatic, no cyanosis Skin: normal color, texture and turgor, no rashes or lesions Lymph: normal cervical supraclavicular and inguinal nodes Neurologic:  grossly normal       Pelvic: External genitalia:  no lesions              Urethra:  normal appearing urethra with no masses, tenderness or lesions              Bartholins and Skenes: normal                 Vagina: normal appearing vagina with normal color and discharge, no lesions              Cervix: no lesions and flush with vagina and stenotic, difficult to get the cytobrush in her cervix.               Bimanual Exam:  Uterus:  normal size, contour, position, consistency, mobility, non-tender              Adnexa: no mass, fullness, tenderness               Rectovaginal: Confirms               Anus:  normal sphincter tone, no lesions   Lovena Le, CMA chaperoned for the exam.     1. Thickened endometrium The patient has a stenotic cervix. We discussed the options of pretreating her with cytotec and having her return for an attempt at endometrial biopsy vs hysteroscopy, D&C. We discussed that if she has an endometrial polyp, it can be removed at the time of hysteroscopy. She would prefer to have the hysteroscopy, D&C. Plan: hysteroscopy,  dilation and curettage with ultrasound guidance. Reviewed risks, including: bleeding, infection, uterine perforation. -Will pretreat with cytotec   2. Stricture and stenosis of cervix - misoprostol (CYTOTEC) 200 MCG tablet; Place 2 tablets vaginally 6-12 hours prior to surgery  Dispense: 2 tablet; Refill: 0   3. Screening for cervical cancer - Cytology - PAP

## 2021-05-10 NOTE — Transfer of Care (Signed)
Immediate Anesthesia Transfer of Care Note  Patient: Sheena White  Procedure(s) Performed: Procedure(s) (LRB): DILATATION & CURETTAGE/HYSTEROSCOPY WITH MYOSURE (N/A) OPERATIVE ULTRASOUND (N/A)  Patient Location: PACU  Anesthesia Type: General  Level of Consciousness: awake, sedated, patient cooperative and responds to stimulation  Airway & Oxygen Therapy: Patient Spontanous Breathing and Patient connected to [place] 02 and soft FM   Post-op Assessment: Report given to PACU RN, Post -op Vital signs reviewed and stable and Patient moving all extremities  Post vital signs: Reviewed and stable  Complications: No apparent anesthesia complications

## 2021-05-10 NOTE — Interval H&P Note (Signed)
History and Physical Interval Note:  05/10/2021 7:08 AM  Sheena White  has presented today for surgery, with the diagnosis of thickened endometrium, stenotic cervix.  The various methods of treatment have been discussed with the patient and family. After consideration of risks, benefits and other options for treatment, the patient has consented to  Procedure(s): [place] (N/A) OPERATIVE ULTRASOUND (N/A) as a surgical intervention.  The patient's history has been reviewed, patient examined, no change in status, stable for surgery.  I have reviewed the patient's chart and labs.  Questions were answered to the patient's satisfaction.     Salvadore Dom

## 2021-05-10 NOTE — Anesthesia Procedure Notes (Signed)
Procedure Name: LMA Insertion Date/Time: 05/10/2021 7:49 AM Performed by: Justice Rocher, CRNA Pre-anesthesia Checklist: Patient identified, Emergency Drugs available, Suction available, Patient being monitored and Timeout performed Patient Re-evaluated:Patient Re-evaluated prior to induction Oxygen Delivery Method: Circle system utilized Preoxygenation: Pre-oxygenation with 100% oxygen Induction Type: IV induction Ventilation: Mask ventilation without difficulty LMA: LMA inserted LMA Size: 4.0 Number of attempts: 1 Airway Equipment and Method: Bite block Placement Confirmation: positive ETCO2, breath sounds checked- equal and bilateral and CO2 detector Tube secured with: Tape Dental Injury: Teeth and Oropharynx as per pre-operative assessment

## 2021-05-10 NOTE — Anesthesia Preprocedure Evaluation (Addendum)
Anesthesia Evaluation  Patient identified by MRN, date of birth, ID band Patient awake    Reviewed: Allergy & Precautions, NPO status , Patient's Chart, lab work & pertinent test results  History of Anesthesia Complications (+) PONV and history of anesthetic complications  Airway Mallampati: II  TM Distance: >3 FB Neck ROM: Full    Dental  (+) Teeth Intact, Dental Advisory Given   Pulmonary neg pulmonary ROS,    breath sounds clear to auscultation       Cardiovascular negative cardio ROS   Rhythm:Regular     Neuro/Psych Anxiety negative neurological ROS     GI/Hepatic Neg liver ROS, GERD  Medicated and Controlled,  Endo/Other  Hypothyroidism   Renal/GU negative Renal ROS     Musculoskeletal  (+) Arthritis ,   Abdominal   Peds  Hematology negative hematology ROS (+) Lab Results      Component                Value               Date                      WBC                      5.4                 09/23/2020                HGB                      12.3                09/23/2020                HCT                      39.1                09/23/2020                MCV                      97.8                09/23/2020                PLT                      271                 09/23/2020              Anesthesia Other Findings   Reproductive/Obstetrics                            Anesthesia Physical Anesthesia Plan  ASA: 2  Anesthesia Plan: General   Post-op Pain Management: Toradol IV (intra-op) and Tylenol PO (pre-op)   Induction: Intravenous  PONV Risk Score and Plan: 4 or greater and Ondansetron, Dexamethasone, Propofol infusion, TIVA and Midazolam  Airway Management Planned: LMA  Additional Equipment: None  Intra-op Plan:   Post-operative Plan: Extubation in OR  Informed Consent: I have reviewed the patients History and Physical, chart, labs and discussed the procedure  including the risks, benefits and alternatives for the proposed  anesthesia with the patient or authorized representative who has indicated his/her understanding and acceptance.     Dental advisory given  Plan Discussed with: CRNA and Anesthesiologist  Anesthesia Plan Comments:         Anesthesia Quick Evaluation

## 2021-05-11 LAB — SURGICAL PATHOLOGY

## 2021-05-11 NOTE — Anesthesia Postprocedure Evaluation (Signed)
Anesthesia Post Note  Patient: Marleta Lapierre  Procedure(s) Performed: DILATATION & CURETTAGE/HYSTEROSCOPY WITH MYOSURE (Uterus) OPERATIVE ULTRASOUND (Uterus)     Patient location during evaluation: PACU Anesthesia Type: General Level of consciousness: awake and alert Pain management: pain level controlled Vital Signs Assessment: post-procedure vital signs reviewed and stable Respiratory status: spontaneous breathing, nonlabored ventilation, respiratory function stable and patient connected to nasal cannula oxygen Cardiovascular status: blood pressure returned to baseline and stable Postop Assessment: no apparent nausea or vomiting Anesthetic complications: no   No notable events documented.  Last Vitals:  Vitals:   05/10/21 0845 05/10/21 0928  BP: (!) 118/58 120/89  Pulse: 65   Resp: 14   Temp:  36.5 C  SpO2: 99% 100%    Last Pain:  Vitals:   05/10/21 0928  TempSrc:   PainSc: 0-No pain                 Viliami Bracco

## 2021-05-12 ENCOUNTER — Encounter (HOSPITAL_BASED_OUTPATIENT_CLINIC_OR_DEPARTMENT_OTHER): Payer: Self-pay | Admitting: Obstetrics and Gynecology

## 2021-05-17 NOTE — Progress Notes (Signed)
GYNECOLOGY  VISIT   HPI: 71 y.o.   Married White or Caucasian Not Hispanic or Latino  female   [phone] with No LMP recorded. Patient is postmenopausal.   here for 1 week post op following hysteroscopy, polypectomy, D&C. Pathology with benign polyp, curettage non-diagnostic (minimal cells, atrophic).  She is doing well, no c/o.   GYNECOLOGIC HISTORY: No LMP recorded. Patient is postmenopausal. Contraception: PMP Menopausal hormone therapy: none         OB History     Gravida  2   Para  2   Term  2   Preterm      AB      Living  2      SAB      IAB      Ectopic      Multiple      Live Births  2              Patient Active Problem List   Diagnosis Date Noted   Hypothyroid 04/09/2021   Cancer of left breast ([place]) 04/25/2020   Malignant neoplasm of upper-outer quadrant of left breast in female, estrogen receptor positive ([place]) 04/13/2020    Past Medical History:  Diagnosis Date   Anxiety    Arthritis    mild   Complication of anesthesia    very easily sedate   GERD (gastroesophageal reflux disease)    History of COVID-19 06/2020   per pt mild to moderate that resolved   History of external beam radiation therapy    completed 09-09-2020  for left breast cancer   History of skin cancer 2000   Hypothyroidism (acquired)    followed by pcp;   per pt had issue since radiation treatment for left breast cancer 02/ 2022   Malignant neoplasm of upper-outer quadrant of left breast in female, estrogen receptor positive ([place]) 04/2020   followed by [facility] , Dr Chauncey Cruel. Dent; dx 01/ 2022  invasive lobular carcinoma;  06-03-2020  s/p left breast lumpectomy w/ node dissection ;  completed radiation 09-09-2020   PONV (postoperative nausea and vomiting)    Psoriasis    Raynaud's disease    Stenosis of cervix    Thickened endometrium     Past Surgical History:  Procedure Laterality Date   BREAST LUMPECTOMY WITH RADIOACTIVE SEED AND SENTINEL LYMPH NODE BIOPSY  Left 06/03/2020   Procedure: LEFT BREAST LUMPECTOMY WITH RADIOACTIVE SEED AND SENTINEL LYMPH NODE BIOPSY;  Surgeon: Jovita Kussmaul, MD;  Location: [facility];  Service: General;  Laterality: Left;   CATARACT EXTRACTION W/ INTRAOCULAR LENS IMPLANT Bilateral 2019   DILATATION & CURETTAGE/HYSTEROSCOPY WITH MYOSURE N/A 05/10/2021   Procedure: DILATATION & CURETTAGE/HYSTEROSCOPY WITH MYOSURE;  Surgeon: Salvadore Dom, MD;  Location: [facility];  Service: Gynecology;  Laterality: N/A;   OPERATIVE ULTRASOUND N/A 05/10/2021   Procedure: OPERATIVE ULTRASOUND;  Surgeon: Salvadore Dom, MD;  Location: [facility];  Service: Gynecology;  Laterality: N/A;   SHOULDER ARTHROSCOPY Left    yrs ago   TONSILLECTOMY     child    Current Outpatient Medications  Medication Sig Dispense Refill   Ascorbic Acid (VITAMIN C PO) Take by mouth daily.     calcipotriene-betamethasone (TACLONEX SCALP) external suspension as needed.     calcium gluconate 500 MG tablet Take 1 tablet by mouth 3 (three) times daily.     CALCIUM PO Take by mouth daily.     clobetasol (TEMOVATE) 0.05 %  external solution as needed.     Dietary Management Product (VB6 P5P PO) Take by mouth.     Digestive Enzymes (DIGESTIVE ENZYME PO) Take by mouth daily.     famotidine (PEPCID) 10 MG tablet Take 10 mg by mouth as needed.     loratadine (CLARITIN) 10 MG tablet Take 10 mg by mouth daily.     Multiple Vitamin (MULTIVITAMIN ADULT PO) Take by mouth daily.     Multiple Vitamins-Minerals (ZINC PO) Take 30 mg by mouth.     Omega-3 Fatty Acids (FISH OIL PO) Take by mouth.     Omega-3 Fatty Acids (OMEGA-3 FISH OIL PO) Take by mouth daily.     Selenium 200 MCG CAPS Take by mouth.     simvastatin (ZOCOR) 10 MG tablet Take 1 tablet (10 mg total) by mouth daily. (Patient taking differently: Take 10 mg by mouth 2 (two) times a week.) 90 tablet 3   thyroid (ARMOUR) 15 MG tablet Take 15 mg by mouth  daily.     Vitamin D-Vitamin K (VITAMIN K2-VITAMIN D3 PO) Take by mouth daily.     No current facility-administered medications for this visit.     ALLERGIES: Codeine, Escitalopram oxalate, Zithromax [azithromycin], Colesevelam, and Sulfa antibiotics  Family History  Problem Relation Age of Onset   Early death Mother    Stroke Mother    Early death Father    Heart disease Father    Colon cancer Maternal Aunt    Alcohol abuse Brother    Drug abuse Brother    Varicose Veins Maternal Grandmother     Social History   Socioeconomic History   Marital status: Married    Spouse name: Not on file   Number of children: Not on file   Years of education: Not on file   Highest education level: Not on file  Occupational History   Not on file  Tobacco Use   Smoking status: Never   Smokeless tobacco: Never  Vaping Use   Vaping Use: Never used  Substance and Sexual Activity   Alcohol use: Not Currently    Alcohol/week: 1.0 standard drink    Types: 1 Glasses of wine per week   Drug use: Never   Sexual activity: Not Currently    Partners: Male    Birth control/protection: Post-menopausal  Other Topics Concern   Not on file  Social History Narrative   Not on file   Social Determinants of Health   Financial Resource Strain: Not on file  Food Insecurity: Not on file  Transportation Needs: Not on file  Physical Activity: Not on file  Stress: Not on file  Social Connections: Not on file  Intimate Partner Violence: Not on file    Review of Systems  All other systems reviewed and are negative.  PHYSICAL EXAMINATION:    BP 110/62    Pulse 72    Wt 123 lb (55.8 kg)    SpO2 99%    BMI 21.11 kg/m     General appearance: alert, cooperative and appears stated age Abdomen: soft, non-tender; non distended, no masses,  no organomegaly  1. History of hysteroscopy Benign pathology, doing well Routine f/u

## 2021-05-19 ENCOUNTER — Other Ambulatory Visit: Payer: Self-pay

## 2021-05-19 ENCOUNTER — Encounter: Payer: Self-pay | Admitting: Obstetrics and Gynecology

## 2021-05-19 ENCOUNTER — Ambulatory Visit (INDEPENDENT_AMBULATORY_CARE_PROVIDER_SITE_OTHER): Payer: Medicare PPO | Admitting: Obstetrics and Gynecology

## 2021-05-19 VITALS — BP 110/62 | HR 72 | Wt 123.0 lb

## 2021-05-19 DIAGNOSIS — Z9889 Other specified postprocedural states: Secondary | ICD-10-CM | POA: Diagnosis not present

## 2021-06-06 IMAGING — MG MM BREAST LOCALIZATION CLIP
4 series · 4 of 12 positions shown · non-contrast
Comparison: Previous exam(s).

CLINICAL DATA: Patient status post ultrasound-guided biopsy left
breast mass.

EXAM:
DIAGNOSTIC LEFT MAMMOGRAM POST ULTRASOUND BIOPSY

[L CC synth-2D]
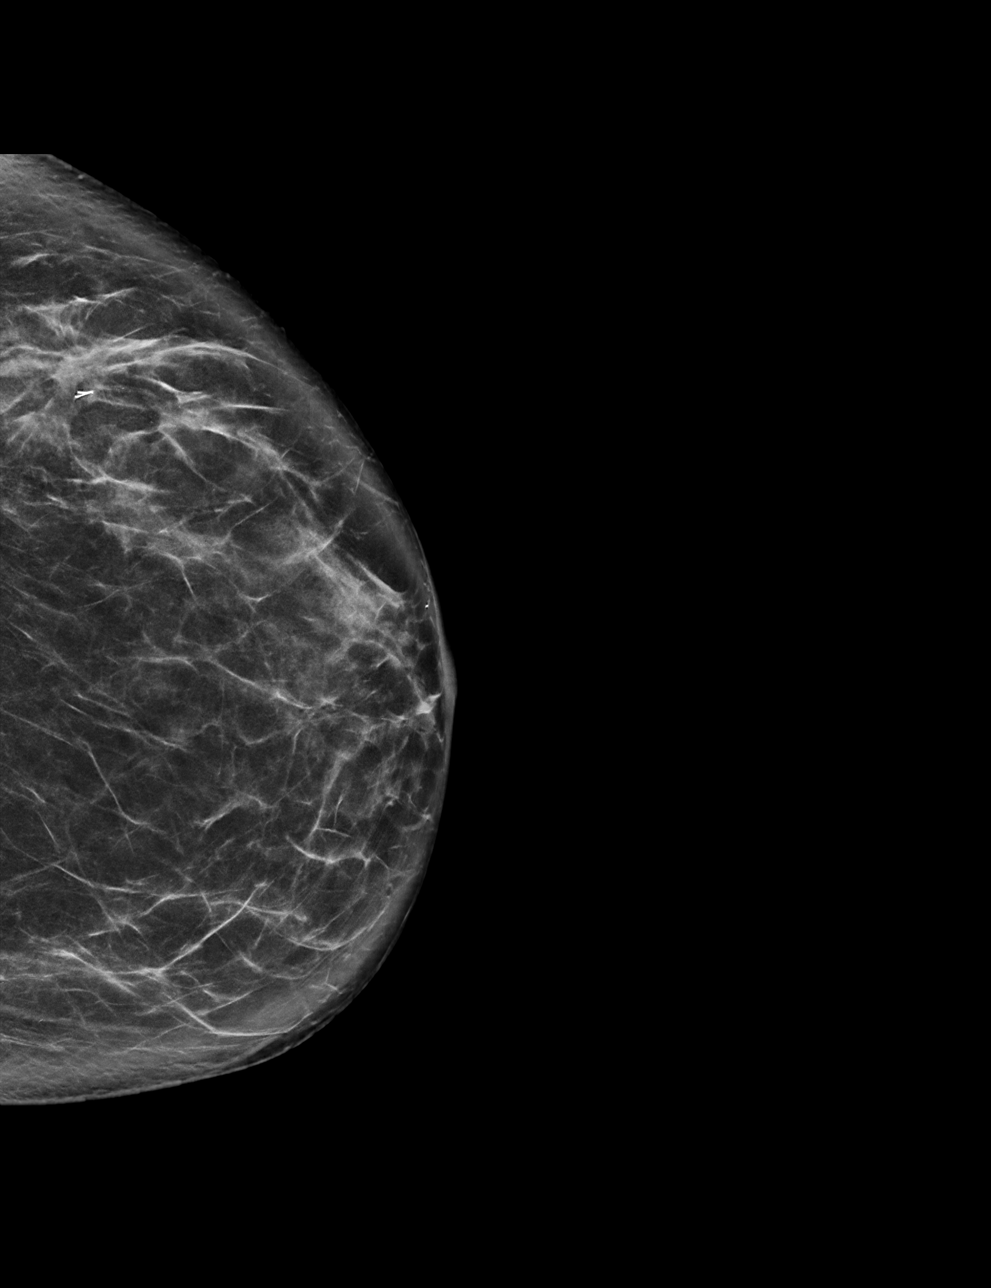

[L ML synth-2D]
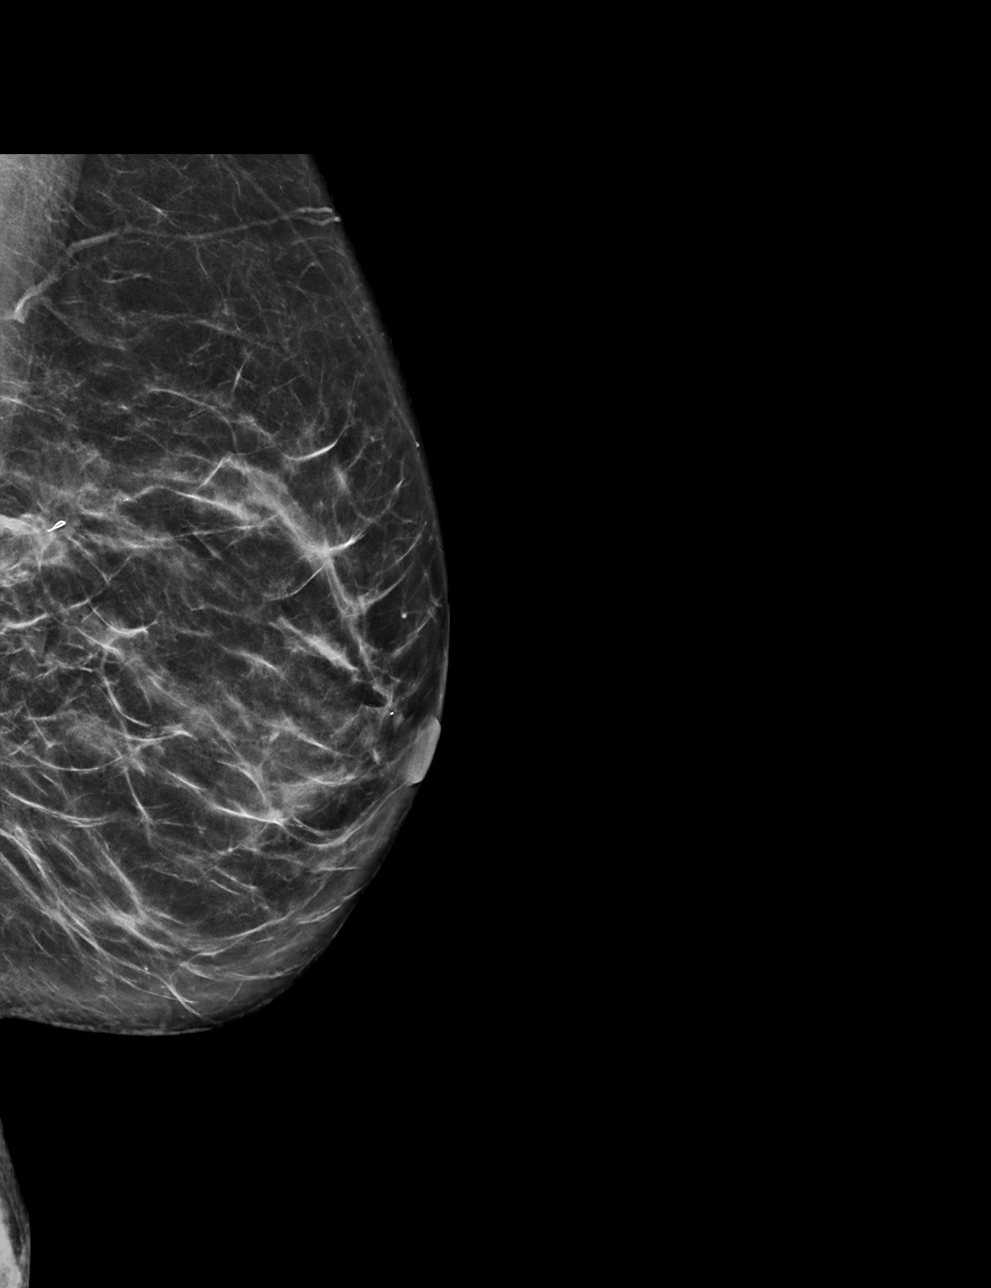

[L CC tomo · tomo slice 37/72.0]
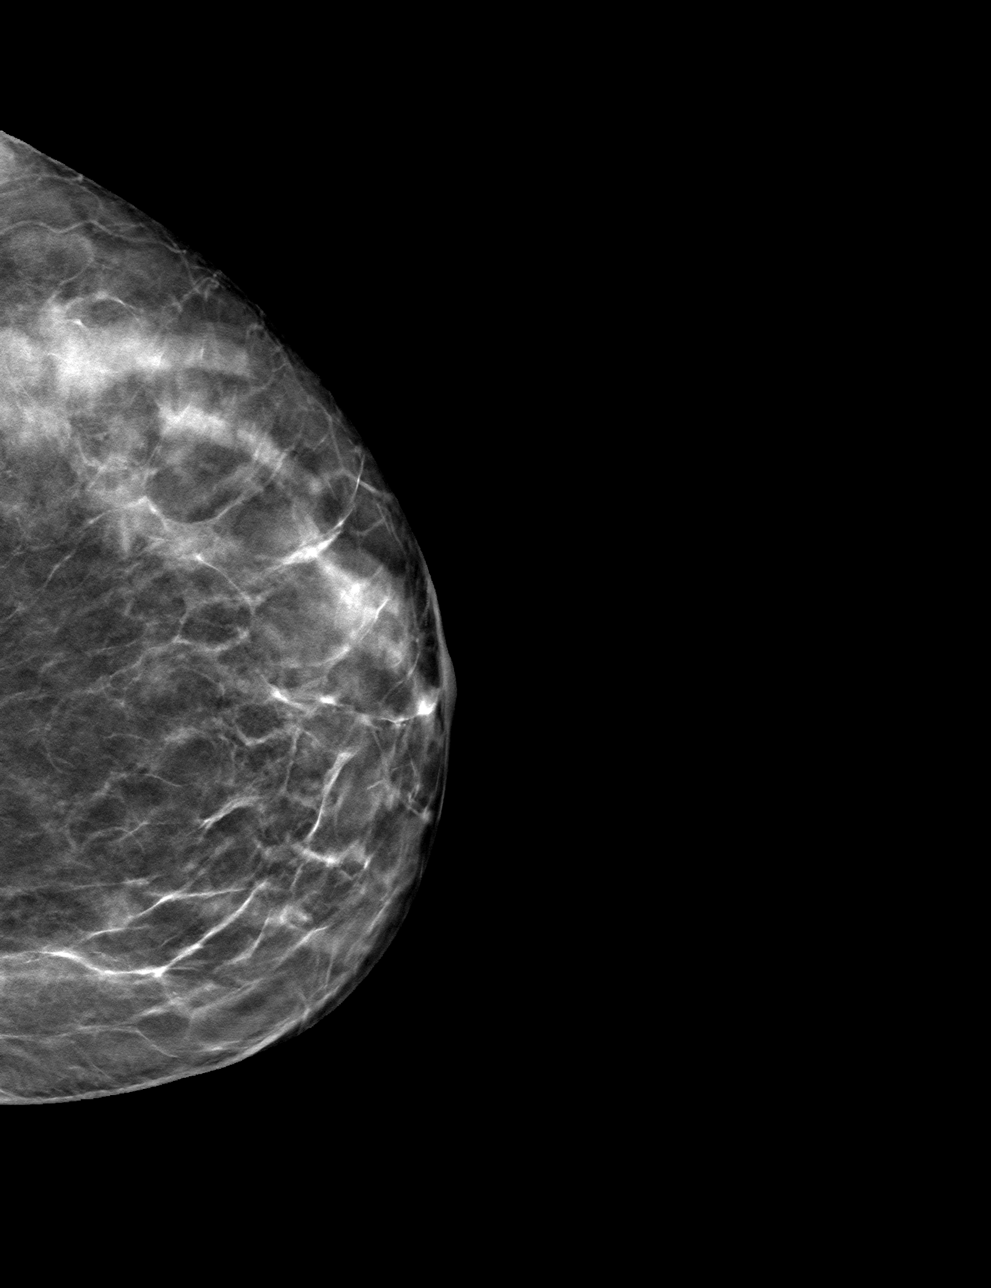

[L ML tomo · tomo slice 37/72.0]
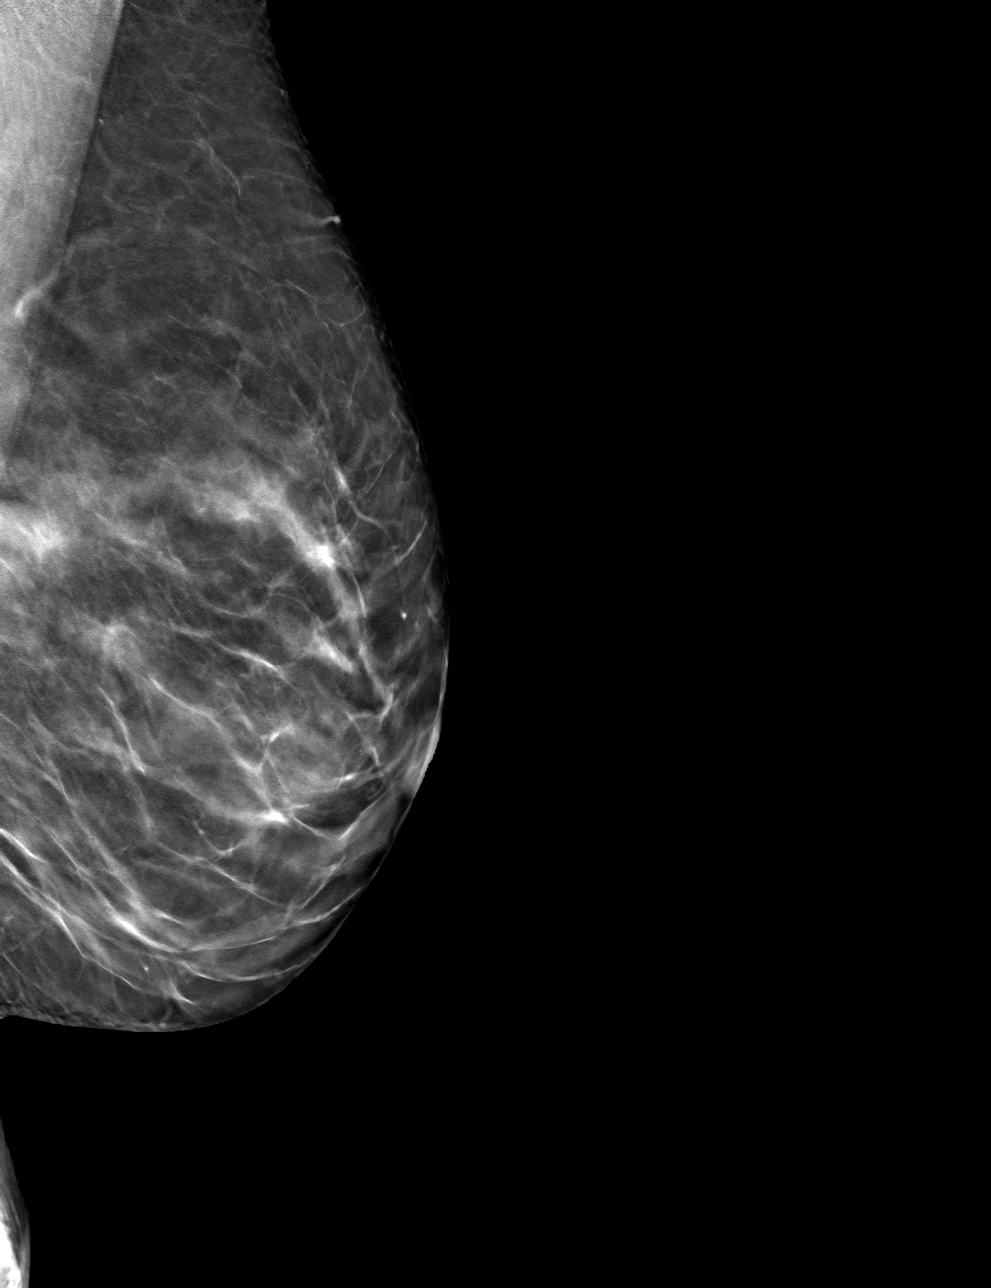

[4 of 12 positions shown; findings below may reference images not displayed]

FINDINGS: Mammographic images were obtained following ultrasound guided biopsy
of left breast mass 3 o'clock position. The biopsy marking clip is
in expected position at the site of biopsy.
IMPRESSION: Appropriate positioning of the ribbon shaped biopsy marking clip at
the site of biopsy in the left breast 3 o'clock position.

Final Assessment: Post Procedure Mammograms for Marker Placement

## 2021-07-07 IMAGING — MR MR BREAST BX W LOC DEV 1ST LESION IMAGE BX SPEC MR GUIDE*L*
7 of 10 series · 33 of 48 positions shown · IV contrast (6 ml gadavist)
Comparison: Previous exams.
COMPARISON: Previous exams.

Addendum:
CLINICAL DATA: 1.0 cm mass in the posterior central left breast at
recent staging MRI of the breasts. This was contiguous with a
recently diagnosed invasive lobular carcinoma in the posterior
aspect of the lower outer quadrant of the left breast

EXAM:
MRI GUIDED CORE NEEDLE BIOPSY OF THE LEFT BREAST
TECHNIQUE: Multiplanar, multisequence MR imaging of the left breast was
performed both before and after administration of intravenous
contrast.
CONTRAST:  6mL GADAVIST GADOBUTROL 1 MMOL/ML IV SOLN

[Series 2: fiducial unilateral · sagittal · 2.0mm · 1.33mm/px · 3 of 52 slices shown]
[im 1/52]
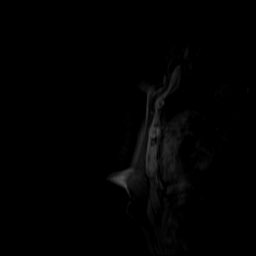
[im 26/52]
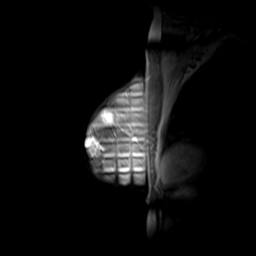
[im 52/52]
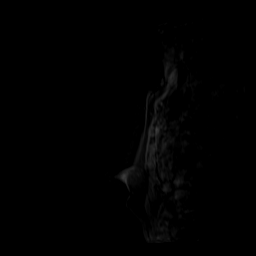

[Series 3: dynamic pre · axial · non-contrast · 1.3mm · 0.73mm/px · z∈[-100,+107]mm · 5 of 160 slices shown]
[im 1/160]
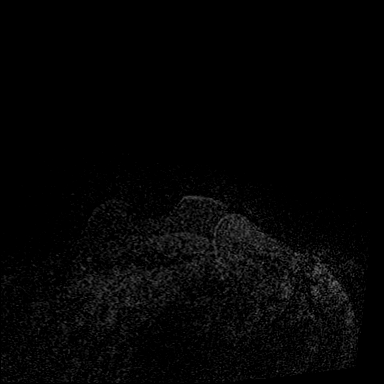
[im 40/160]
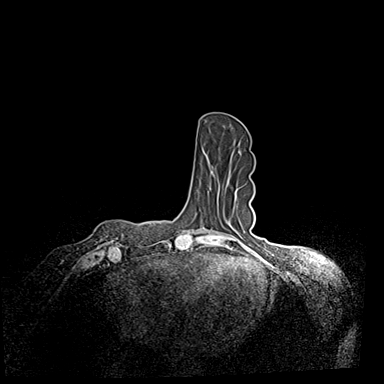
[im 80/160]
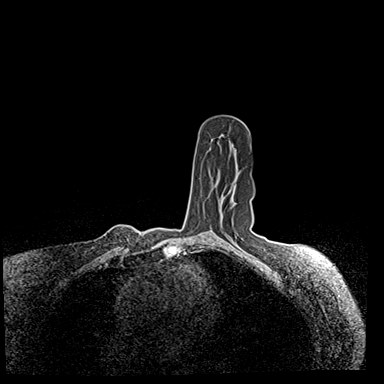
[im 120/160]
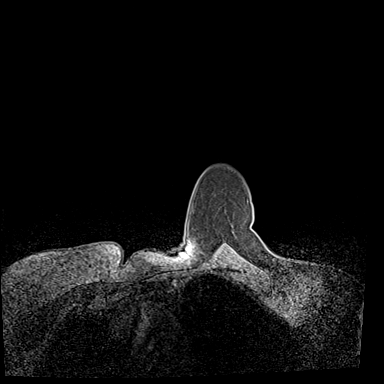
[im 160/160]
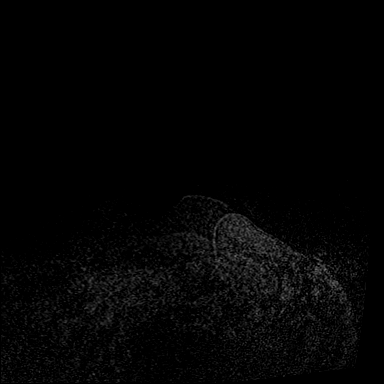

[Series 4: dynamic post 20 · axial · 1.3mm · 0.73mm/px · z∈[-100,+107]mm · 5 of 160 slices shown (1 of 2)]
[im 1/160]
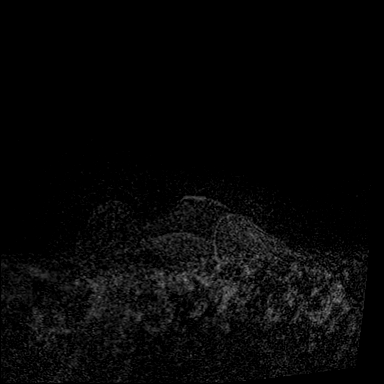
[im 40/160]
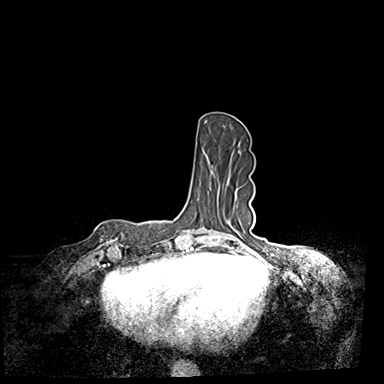
[im 80/160]
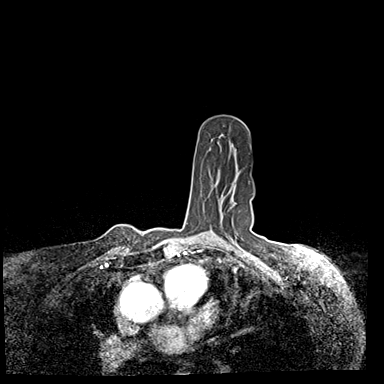
[im 120/160]
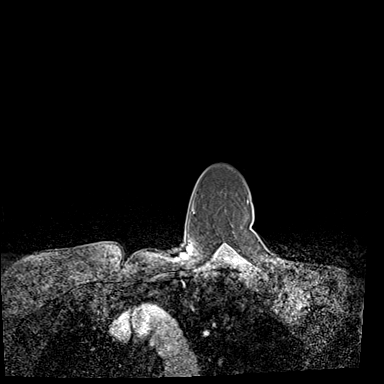
[im 160/160]
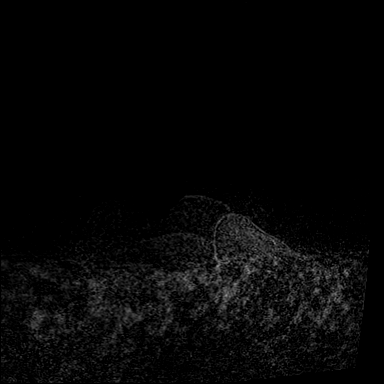

[Series 5: dynamic post 20 · axial · 1.3mm · 0.73mm/px · z∈[-100,+107]mm · 5 of 160 slices shown (2 of 2)]
[im 1/160]
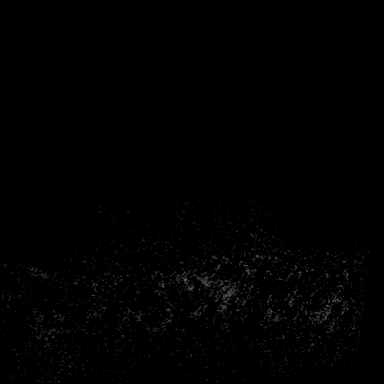
[im 40/160]
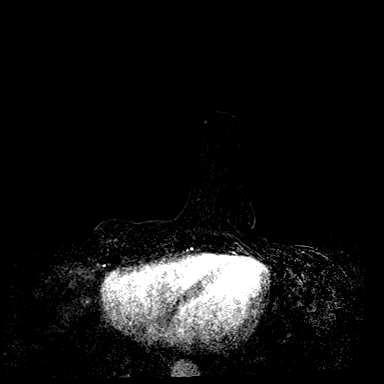
[im 80/160]
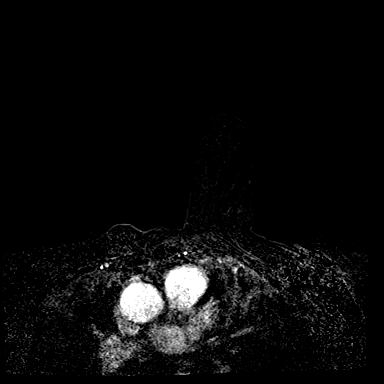
[im 120/160]
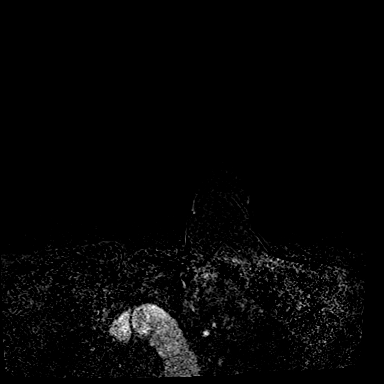
[im 160/160]
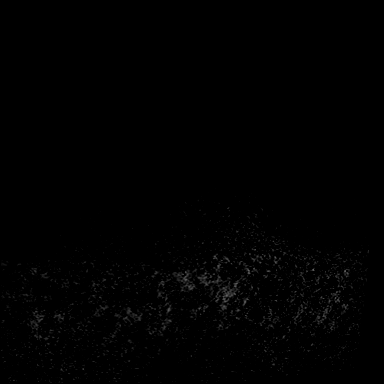

[Series 6: dynamic post 3 · axial · 1.3mm · 0.73mm/px · z∈[-100,+107]mm · 5 of 160 slices shown (1 of 2)]
[im 1/160]
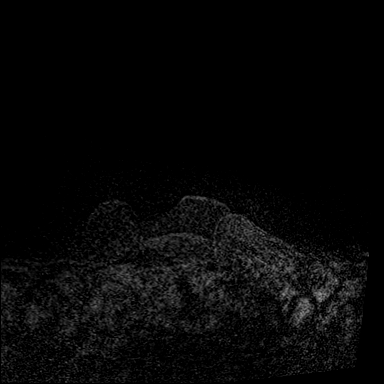
[im 40/160]
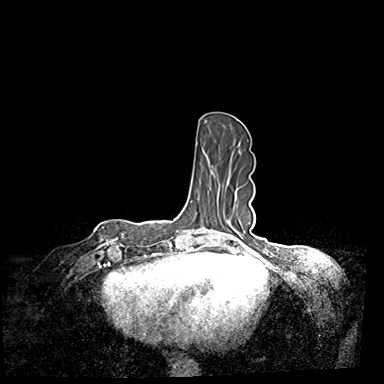
[im 80/160]
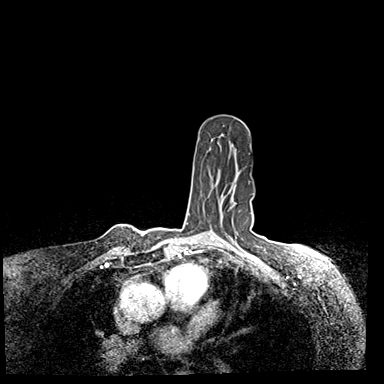
[im 120/160]
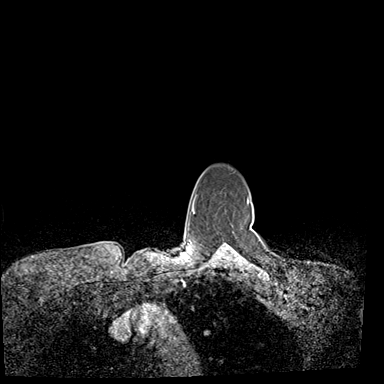
[im 160/160]
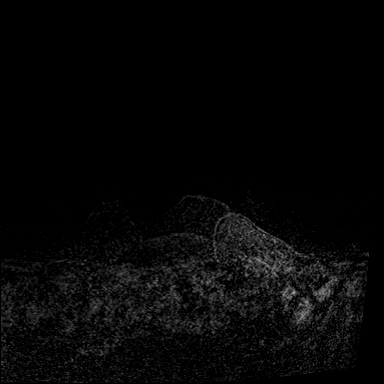

[Series 7: dynamic post 3 · axial · 1.3mm · 0.73mm/px · z∈[-100,+107]mm · 5 of 160 slices shown (2 of 2)]
[im 1/160]
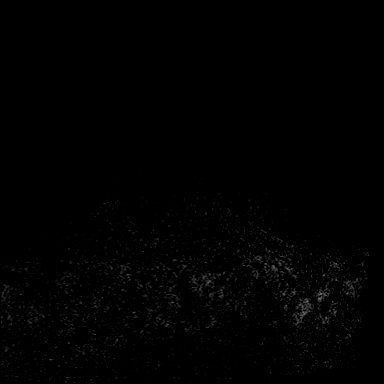
[im 40/160]
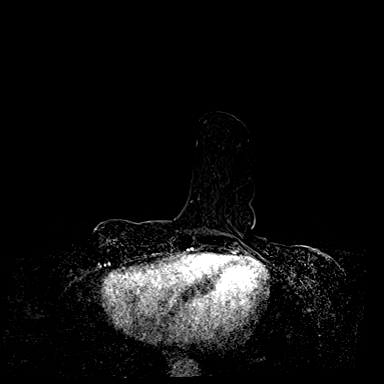
[im 80/160]
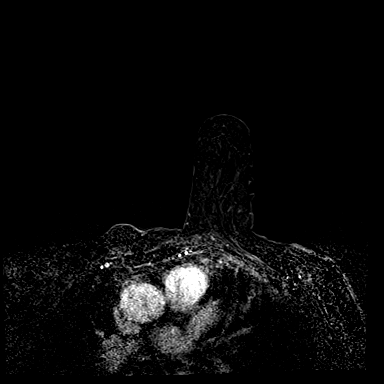
[im 120/160]
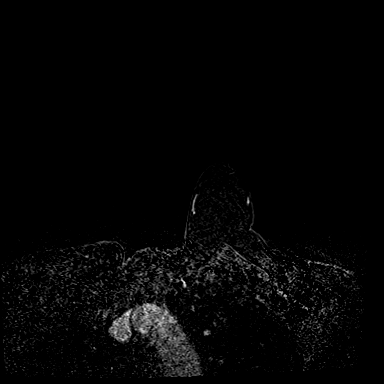
[im 160/160]
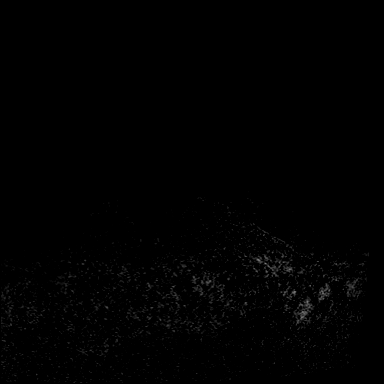

[Series 8: needle confirmation · axial · 1.3mm · 0.73mm/px · z∈[-100,+107]mm · 5 of 160 slices shown]
[im 1/160]
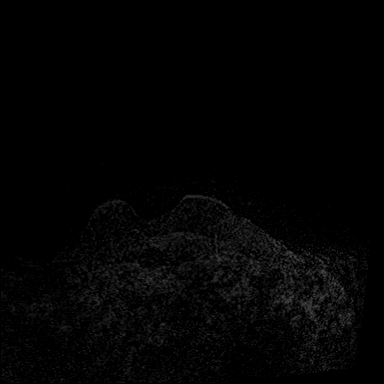
[im 40/160]
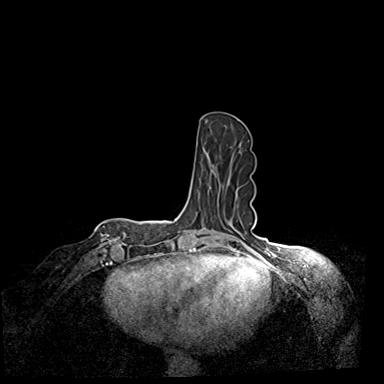
[im 80/160]
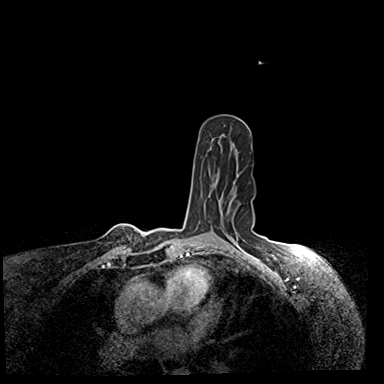
[im 120/160]
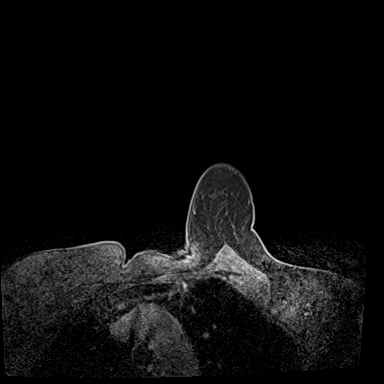
[im 160/160]
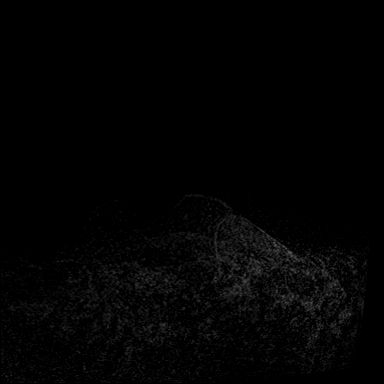

[33 of 48 positions shown; findings below may reference images not displayed]

FINDINGS: I met with the patient, and we discussed the procedure of MRI guided
biopsy, including risks, benefits, and alternatives. Specifically,
we discussed the risks of infection, bleeding, tissue injury, clip
migration, and inadequate sampling. Informed, written consent was
given. The usual time out protocol was performed immediately prior
to the procedure.

Using sterile technique, 1% Lidocaine, MRI guidance, and a 9 gauge
vacuum assisted device, biopsy was performed of the recently
demonstrated 1.0 cm mass in the posterior central left breast using
a lateral approach. At the conclusion of the procedure, a cylinder
shaped tissue marker clip was deployed into the biopsy cavity.
Follow-up 2-view mammogram was performed and dictated separately.
IMPRESSION: MRI guided biopsy of the recently demonstrated 1.0 cm mass in the
posterior central left breast. No apparent complications.

ADDENDUM:
Pathology revealed GRADE II INVASIVE MAMMARY CARCINOMA, MAMMARY
CARCINOMA IN SITU of the Left breast, posterior central.
Immunohistochemistry for E-cadherin is negative supporting a lobular
phenotype. This was found to be concordant by Dr. Dmswajj Damble.

Pathology results were discussed with the patient by telephone. The
patient reported doing well after the biopsy with tenderness at the
site. Post biopsy instructions and care were reviewed and questions
were answered. The patient was encouraged to call The [REDACTED] for any additional concerns. My direct phone
number was provided.

The patient has a recent diagnosis of Left breast cancer and should
follow her outlined treatment plan.

The patient is scheduled for Left breast surgery on June 03, 2020
with Dr. Yennifer Katherine.

Pathology results reported by Tatushi Kasalo, RN on 05/13/2020.

*** End of Addendum ***
FINDINGS: I met with the patient, and we discussed the procedure of MRI guided
biopsy, including risks, benefits, and alternatives. Specifically,
we discussed the risks of infection, bleeding, tissue injury, clip
migration, and inadequate sampling. Informed, written consent was
given. The usual time out protocol was performed immediately prior
to the procedure.

Using sterile technique, 1% Lidocaine, MRI guidance, and a 9 gauge
vacuum assisted device, biopsy was performed of the recently
demonstrated 1.0 cm mass in the posterior central left breast using
a lateral approach. At the conclusion of the procedure, a cylinder
shaped tissue marker clip was deployed into the biopsy cavity.
Follow-up 2-view mammogram was performed and dictated separately.
IMPRESSION: MRI guided biopsy of the recently demonstrated 1.0 cm mass in the
posterior central left breast. No apparent complications.

## 2021-08-31 ENCOUNTER — Encounter (HOSPITAL_COMMUNITY): Payer: Self-pay

## 2021-11-08 ENCOUNTER — Other Ambulatory Visit: Payer: Self-pay | Admitting: Family Medicine

## 2021-11-08 ENCOUNTER — Ambulatory Visit
Admission: RE | Admit: 2021-11-08 | Discharge: 2021-11-08 | Disposition: A | Discharge status: Home / Self Care | Payer: Medicare PPO | Source: Ambulatory Visit | Attending: Family Medicine | Admitting: Family Medicine

## 2021-11-08 DIAGNOSIS — M1612 Unilateral primary osteoarthritis, left hip: Secondary | ICD-10-CM | POA: Diagnosis not present

## 2021-11-08 DIAGNOSIS — M25552 Pain in left hip: Secondary | ICD-10-CM

## 2022-01-03 DIAGNOSIS — C50912 Malignant neoplasm of unspecified site of left female breast: Secondary | ICD-10-CM | POA: Diagnosis not present

## 2022-01-03 DIAGNOSIS — Z170 Estrogen receptor positive status [ER+]: Secondary | ICD-10-CM | POA: Diagnosis not present

## 2022-01-03 DIAGNOSIS — C50812 Malignant neoplasm of overlapping sites of left female breast: Secondary | ICD-10-CM | POA: Diagnosis not present

## 2022-02-10 DIAGNOSIS — H26493 Other secondary cataract, bilateral: Secondary | ICD-10-CM | POA: Diagnosis not present

## 2022-04-01 ENCOUNTER — Encounter: Payer: Self-pay | Admitting: Obstetrics and Gynecology

## 2022-04-12 DIAGNOSIS — R197 Diarrhea, unspecified: Secondary | ICD-10-CM | POA: Diagnosis not present

## 2022-04-13 ENCOUNTER — Telehealth: Payer: Self-pay | Admitting: Gastroenterology

## 2022-04-13 NOTE — Telephone Encounter (Signed)
Called patient when records were received, she was being treated at [facility] for diarrhea, will call back if and when she decides to move forward with request. Records scanned into Media.

## 2022-04-14 DIAGNOSIS — R197 Diarrhea, unspecified: Secondary | ICD-10-CM | POA: Diagnosis not present

## 2022-04-26 ENCOUNTER — Encounter: Payer: Self-pay | Admitting: Gastroenterology

## 2022-05-10 DIAGNOSIS — R197 Diarrhea, unspecified: Secondary | ICD-10-CM | POA: Diagnosis not present

## 2022-05-19 DIAGNOSIS — C50912 Malignant neoplasm of unspecified site of left female breast: Secondary | ICD-10-CM | POA: Diagnosis not present

## 2022-05-19 DIAGNOSIS — Z170 Estrogen receptor positive status [ER+]: Secondary | ICD-10-CM | POA: Diagnosis not present

## 2022-05-19 DIAGNOSIS — R92323 Mammographic fibroglandular density, bilateral breasts: Secondary | ICD-10-CM | POA: Diagnosis not present

## 2022-05-20 ENCOUNTER — Telehealth: Payer: Self-pay | Admitting: Gastroenterology

## 2022-05-20 NOTE — Telephone Encounter (Signed)
Hi Dr. Tarri White,  This patient has requested a transfer of care to you.  Her records from [place] are scanned into media.  Her last GI doc, Dr. Cristina Gong has retired and she wants her, and ultimately her husband, to become established with our practice here.  Her chart shows she went to [facility] on 1/16.  At that time, she was having trouble with severe diarrhea and states they were the only practice that could see her.  She does not want to establish care with them and really wants to see you.  Please let me know how you would like to proceed.  Thank you.

## 2022-05-25 ENCOUNTER — Encounter: Payer: Self-pay | Admitting: Internal Medicine

## 2022-05-25 NOTE — Telephone Encounter (Signed)
Hi Dr. [facility] Courier,  This patient originally wanted a transfer of care to Dr. Tarri Glenn.  Dr. Tarri Glenn agreed to see her but wanted to make sure the patient knew she would not be available after April.  After speaking with the patient, she asked for a female doctor who would be here long-term.    With that being said, her information is scanned into media.  Let me know if you accept the transfer and how you would like me to proceed.  Thank you.

## 2022-05-27 ENCOUNTER — Ambulatory Visit: Payer: Medicare PPO | Admitting: Gastroenterology

## 2022-07-11 ENCOUNTER — Encounter: Payer: Self-pay | Admitting: Internal Medicine

## 2022-07-11 ENCOUNTER — Ambulatory Visit: Payer: Medicare PPO | Admitting: Internal Medicine

## 2022-07-11 VITALS — BP 120/70 | HR 64 | Ht 64.0 in | Wt 128.0 lb

## 2022-07-11 DIAGNOSIS — R197 Diarrhea, unspecified: Secondary | ICD-10-CM | POA: Diagnosis not present

## 2022-07-11 MED ORDER — COLESTIPOL HCL 1 G PO TABS: 4.0000 g | ORAL_TABLET | Freq: Every evening | ORAL | 3 refills | Status: DC

## 2022-07-11 MED ORDER — NA SULFATE-K SULFATE-MG SULF 17.5-3.13-1.6 GM/177ML PO SOLN: ORAL | 0 refills | Status: DC

## 2022-07-11 NOTE — Progress Notes (Unsigned)
Chief Complaint: Diarrhea  HPI : 73 year old female with history of lobular breast cancer s/p surgery/radiation, hypothyroidism, arthritis, and psoriasis presents with diarrhea  Starting in 12/2021, she started having worsened diarrhea. Her last GI physician Dr. Cristina Gong retired so she sought out a new GI doctor.  Because she cannot be seen in our clinic immediately, she was initially seen with [facility] for further evaluation of her diarrhea. She was started on Questran for bile acid diarrhea, which has helped immensely.  She would prefer to get her colonoscopy procedure done here.  Denies blood in the stools currently. She has had blood in stools in the past when she had a 3 month bout of diarrhea. Since she started the cholestyramine, she has started gaining weight, likely due to some dietary changes (she used to eat mainly salads but now has incorporated more carbs). She is on average having 2 BMs per day. She was having some LPR issues, but this has improved with dietary changes as well. She was able to come off of Pepcid. She will take psyllium QD. Denies dysphagia or N&V. The only pain that she experiences is a little bit of RUQ ab pain if she moves a certain direction. Denies family history of GI issues  Wt Readings from Last 3 Encounters:  07/11/22 128 lb (58.1 kg)  05/19/21 123 lb (55.8 kg)  05/10/21 122 lb 11.2 oz (55.7 kg)   Past Medical History:  Diagnosis Date   Anxiety    Arthritis    mild   Complication of anesthesia    very easily sedate   GERD (gastroesophageal reflux disease)    History of COVID-19 06/2020   per pt mild to moderate that resolved   History of external beam radiation therapy    completed 09-09-2020  for left breast cancer   History of skin cancer 2000   Hypothyroidism (acquired)    followed by pcp;   per pt had issue since radiation treatment for left breast cancer 02/ 2022   Malignant neoplasm of upper-outer quadrant of left breast in  female, estrogen receptor positive ([facility]) 04/2020   followed by [facility] , Dr Chauncey Cruel. [facility]; dx 01/ 2022  invasive lobular carcinoma;  06-03-2020  s/p left breast lumpectomy w/ node dissection ;  completed radiation 09-09-2020   PONV (postoperative nausea and vomiting)    Psoriasis    Raynaud's disease    Stenosis of cervix    Thickened endometrium      Past Surgical History:  Procedure Laterality Date   BREAST LUMPECTOMY WITH RADIOACTIVE SEED AND SENTINEL LYMPH NODE BIOPSY Left 06/03/2020   Procedure: LEFT BREAST LUMPECTOMY WITH RADIOACTIVE SEED AND SENTINEL LYMPH NODE BIOPSY;  Surgeon: Jovita Kussmaul, MD;  Location: [facility];  Service: General;  Laterality: Left;   CATARACT EXTRACTION W/ INTRAOCULAR LENS IMPLANT Bilateral 2019   COLONOSCOPY  12/2005   DILATATION & CURETTAGE/HYSTEROSCOPY WITH MYOSURE N/A 05/10/2021   Procedure: DILATATION & CURETTAGE/HYSTEROSCOPY WITH MYOSURE;  Surgeon: Salvadore Dom, MD;  Location: [facility];  Service: Gynecology;  Laterality: N/A;   OPERATIVE ULTRASOUND N/A 05/10/2021   Procedure: OPERATIVE ULTRASOUND;  Surgeon: Salvadore Dom, MD;  Location: [facility];  Service: Gynecology;  Laterality: N/A;   SHOULDER ARTHROSCOPY Left    yrs ago   TONSILLECTOMY     child   Family History  Problem Relation Age of Onset   Early death Mother    Stroke  Mother    Early death Father    Heart disease Father    Colon cancer Maternal Aunt    Alcohol abuse Brother    Drug abuse Brother    Varicose Veins Maternal Grandmother    Social History   Tobacco Use   Smoking status: Never   Smokeless tobacco: Never  Vaping Use   Vaping Use: Never used  Substance Use Topics   Alcohol use: Not Currently    Alcohol/week: 1.0 standard drink of alcohol    Types: 1 Glasses of wine per week   Drug use: Never   Current Outpatient Medications  Medication Sig Dispense Refill   Ascorbic Acid (VITAMIN C PO)  Take by mouth daily.     calcipotriene-betamethasone (TACLONEX SCALP) external suspension as needed.     calcium gluconate 500 MG tablet Take 1 tablet by mouth daily.     CALCIUM PO Take 1 Capful by mouth in the morning and at bedtime.     cholestyramine (QUESTRAN) 4 g packet Take 4 g by mouth at bedtime.     clobetasol (TEMOVATE) 0.05 % external solution as needed.     Digestive Enzymes (DIGESTIVE ENZYME PO) Take by mouth daily.     Diindolylmethane POWD 1 Capful by Does not apply route in the morning and at bedtime.     famotidine (PEPCID) 10 MG tablet Take 10 mg by mouth as needed.     MAGNESIUM GLYCINATE PO Take 1 Capful by mouth in the morning and at bedtime.     Milk Thistle 300 MG CAPS Take 600 mg by mouth daily.     Omega-3 Fatty Acids (OMEGA-3 FISH OIL PO) Take by mouth daily.     Selenium 200 MCG CAPS Take by mouth.     Specialty Vitamins Products (METHYL-GUARD PLUS PO) Take 1 Capful by mouth daily.     thyroid (ARMOUR) 15 MG tablet Take 60 mg by mouth daily.     Turmeric 500 MG TABS Take 750 mg by mouth daily.     Vitamin D-Vitamin K (VITAMIN K2-VITAMIN D3 PO) Take by mouth daily.     Zinc 50 MG TABS Take 1 tablet by mouth daily.     No current facility-administered medications for this visit.   Allergies  Allergen Reactions   Codeine Nausea And Vomiting   Escitalopram Oxalate Other (See Comments)    "sensitivity"   Zithromax [Azithromycin] Other (See Comments)    Never wants to take again , "made me sick"   Colesevelam Rash   Sulfa Antibiotics Rash     Review of Systems: All systems reviewed and negative except where noted in HPI.   Physical Exam: BP 120/70   Pulse 64   Ht 5\' 4"  (1.626 m)   Wt 128 lb (58.1 kg)   BMI 21.97 kg/m  Constitutional: Pleasant,well-developed, female in no acute distress. HEENT: Normocephalic and atraumatic. Conjunctivae are normal. No scleral icterus. Cardiovascular: Normal rate, regular rhythm.  Pulmonary/chest: Effort normal and  breath sounds normal. No wheezing, rales or rhonchi. Abdominal: Soft, nondistended, nontender. Bowel sounds active throughout. There are no masses palpable. No hepatomegaly. Extremities: No edema Neurological: Alert and oriented to person place and time. Skin: Skin is warm and dry. No rashes noted. Psychiatric: Normal mood and affect. Behavior is normal.  Labs 03/2022: GI path panel and C dif negative. CRP nml. ESR mildly elevated at 37.  CT A/P w/contrast 03/10/21 Impression:  1. No definite evidence for metastatic disease.  2.  Nonspecific thickening  of the endometrial complex, may be related to  tamoxifen related treatment change. Recommend pelvic ultrasound and  Gynecology consultation.  Colonoscopy 12/30/05: Excellent prep. Normal terminal ileum. Normal colon.   Colonoscopy 01/07/16: Colon nml. Terminal ileum nml. Excellent quality prep.   ASSESSMENT AND PLAN: Diarrhea  History of lobular breast cancer Patient presents with diarrhea for the last 6 months that has improved significantly on bile acid binder therapy. She would like to give colestipol a trial since this is a pill based medication and would be easier to take. If the colestipol is not effective, then okay to switch back to cholestyramine. Will plan for colonoscopy for further evaluation to look for signs of microscopic colitis.  Last colonoscopy was in 2017 that was normal. She does have a history of lobular breast cancer so also wants to rule out for colon metastasis. - Continue daily psyllium - Switch from cholestyramine to colestipol 4 mg QHS - Colonoscopy [facility]  Christia Reading, MD  I spent 61 minutes of time, including in depth chart review, independent review of results as outlined above, communicating results with the patient directly, face-to-face time with the patient, coordinating care, ordering studies and medications as appropriate, and documentation.

## 2022-07-11 NOTE — Patient Instructions (Signed)
You have been scheduled for a colonoscopy. Please follow written instructions given to you at your visit today.  Please pick up your prep supplies at the pharmacy within the next 1-3 days. If you use inhalers (even only as needed), please bring them with you on the day of your procedure.   We have sent the following medications to your pharmacy for you to pick up at your convenience: Suprep _______________________________________________________  If your blood pressure at your visit was 140/90 or greater, please contact your primary care physician to follow up on this.  _______________________________________________________  If you are age 71 or older, your body mass index should be between 23-30. Your Body mass index is 21.97 kg/m. If this is out of the aforementioned range listed, please consider follow up with your Primary Care Provider.  If you are age 71 or younger, your body mass index should be between 19-25. Your Body mass index is 21.97 kg/m. If this is out of the aformentioned range listed, please consider follow up with your Primary Care Provider.   ________________________________________________________  The [facility] providers would like to encourage you to use [facility] to communicate with providers for non-urgent requests or questions.  Due to long hold times on the telephone, sending your provider a message by [facility] may be a faster and more efficient way to get a response.  Please allow 48 business hours for a response.  Please remember that this is for non-urgent requests.  _______________________________________________________   Due to recent changes in healthcare laws, you may see the results of your imaging and laboratory studies on MyChart before your provider has had a chance to review them.  We understand that in some cases there may be results that are confusing or concerning to you. Not all laboratory results come back in the same time frame and the provider may be  waiting for multiple results in order to interpret others.  Please give [place] 48 hours in order for your provider to thoroughly review all the results before contacting the office for clarification of your results.    Thank you for entrusting me with your care and for choosing [facility], Dr. Christia Reading

## 2022-07-29 ENCOUNTER — Encounter: Payer: Self-pay | Admitting: Internal Medicine

## 2022-08-07 ENCOUNTER — Other Ambulatory Visit: Payer: Self-pay | Admitting: Internal Medicine

## 2022-08-09 ENCOUNTER — Ambulatory Visit (AMBULATORY_SURGERY_CENTER): Payer: Medicare PPO | Admitting: Internal Medicine

## 2022-08-09 ENCOUNTER — Encounter: Payer: Self-pay | Admitting: Internal Medicine

## 2022-08-09 VITALS — BP 105/60 | HR 57 | Temp 97.1°F | Resp 12 | Ht 64.0 in | Wt 128.0 lb

## 2022-08-09 DIAGNOSIS — R197 Diarrhea, unspecified: Secondary | ICD-10-CM

## 2022-08-09 DIAGNOSIS — E039 Hypothyroidism, unspecified: Secondary | ICD-10-CM | POA: Diagnosis not present

## 2022-08-09 DIAGNOSIS — F419 Anxiety disorder, unspecified: Secondary | ICD-10-CM | POA: Diagnosis not present

## 2022-08-09 DIAGNOSIS — K529 Noninfective gastroenteritis and colitis, unspecified: Secondary | ICD-10-CM | POA: Diagnosis not present

## 2022-08-09 MED ORDER — SODIUM CHLORIDE 0.9 % IV SOLN: 500.0000 mL | Freq: Once | INTRAVENOUS | Status: DC

## 2022-08-09 NOTE — Progress Notes (Signed)
GASTROENTEROLOGY PROCEDURE H&P NOTE   Primary Care Physician: Lavada Mesi, MD    Reason for Procedure:   Diarrhea, history of lobular breast cancer  Plan:    Colonoscopy  Patient is appropriate for endoscopic procedure(s) in the ambulatory ([facility]) setting.  The nature of the procedure, as well as the risks, benefits, and alternatives were carefully and thoroughly reviewed with the patient. Ample time for discussion and questions allowed. The patient understood, was satisfied, and agreed to proceed.     HPI: Sheena White is a 72 y.o. female who presents for colonoscopy for evaluation of diarrhea and history of lobular breast cancer .  Patient was most recently seen in the Gastroenterology Clinic on 07/11/22.  No interval change in medical history since that appointment. Please refer to that note for full details regarding GI history and clinical presentation.   Past Medical History:  Diagnosis Date   Anxiety    Arthritis    mild   Complication of anesthesia    very easily sedate   GERD (gastroesophageal reflux disease)    History of COVID-19 06/2020   per pt mild to moderate that resolved   History of external beam radiation therapy    completed 09-09-2020  for left breast cancer   History of skin cancer 2000   Hypothyroidism (acquired)    followed by pcp;   per pt had issue since radiation treatment for left breast cancer 02/ 2022   Malignant neoplasm of upper-outer quadrant of left breast in female, estrogen receptor positive 04/2020   followed by [facility] , Dr Kathie Rhodes. Dent; dx 01/ 2022  invasive lobular carcinoma;  06-03-2020  s/p left breast lumpectomy w/ node dissection ;  completed radiation 09-09-2020   PONV (postoperative nausea and vomiting)    Psoriasis    Raynaud's disease    Stenosis of cervix    Thickened endometrium     Past Surgical History:  Procedure Laterality Date   BREAST LUMPECTOMY WITH RADIOACTIVE SEED AND SENTINEL LYMPH NODE BIOPSY  Left 06/03/2020   Procedure: LEFT BREAST LUMPECTOMY WITH RADIOACTIVE SEED AND SENTINEL LYMPH NODE BIOPSY;  Surgeon: Griselda Miner, MD;  Location: [facility];  Service: General;  Laterality: Left;   CATARACT EXTRACTION W/ INTRAOCULAR LENS IMPLANT Bilateral 2019   COLONOSCOPY  12/2005   DILATATION & CURETTAGE/HYSTEROSCOPY WITH MYOSURE N/A 05/10/2021   Procedure: DILATATION & CURETTAGE/HYSTEROSCOPY WITH MYOSURE;  Surgeon: Romualdo Bolk, MD;  Location: [facility];  Service: Gynecology;  Laterality: N/A;   OPERATIVE ULTRASOUND N/A 05/10/2021   Procedure: OPERATIVE ULTRASOUND;  Surgeon: Romualdo Bolk, MD;  Location: [facility];  Service: Gynecology;  Laterality: N/A;   SHOULDER ARTHROSCOPY Left    yrs ago   TONSILLECTOMY     child    Prior to Admission medications   Medication Sig Start Date End Date Taking? Authorizing Provider  Ascorbic Acid (VITAMIN C PO) Take by mouth daily.   Yes [provider]  calcipotriene-betamethasone (TACLONEX SCALP) external suspension as needed. 02/25/20  Yes [provider]  calcium gluconate 500 MG tablet Take 1 tablet by mouth daily.   Yes [provider]  CALCIUM PO Take 1 Capful by mouth in the morning and at bedtime.   Yes [provider]  clobetasol (TEMOVATE) 0.05 % external solution as needed. 02/25/20  Yes [provider]  colestipol (COLESTID) 1 g tablet TAKE 4 TABLETS (4 G TOTAL) BY MOUTH AT BEDTIME. 08/08/22  Yes  Imogene Burn, MD  Digestive Enzymes (DIGESTIVE ENZYME PO) Take by mouth daily.   Yes [provider]  Diindolylmethane POWD 1 Capful by Does not apply route in the morning and at bedtime.   Yes [provider]  MAGNESIUM GLYCINATE PO Take 1 Capful by mouth in the morning and at bedtime.   Yes [provider]  Omega-3 Fatty Acids (OMEGA-3 FISH OIL PO) Take by mouth daily.   Yes [provider]  Selenium  200 MCG CAPS Take by mouth.   Yes [provider]  Specialty Vitamins Products (METHYL-GUARD PLUS PO) Take 1 Capful by mouth daily.   Yes [provider]  thyroid (ARMOUR) 15 MG tablet Take 60 mg by mouth daily. 01/27/21  Yes [provider]  Turmeric 500 MG TABS Take 750 mg by mouth daily.   Yes [provider]  Vitamin D-Vitamin K (VITAMIN K2-VITAMIN D3 PO) Take by mouth daily.   Yes [provider]  Zinc 50 MG TABS Take 1 tablet by mouth daily.   Yes [provider]  famotidine (PEPCID) 10 MG tablet Take 10 mg by mouth as needed. 10/20/16   [provider]  Milk Thistle 300 MG CAPS Take 600 mg by mouth daily.    [provider]    Current Outpatient Medications  Medication Sig Dispense Refill   Ascorbic Acid (VITAMIN C PO) Take by mouth daily.     calcipotriene-betamethasone (TACLONEX SCALP) external suspension as needed.     calcium gluconate 500 MG tablet Take 1 tablet by mouth daily.     CALCIUM PO Take 1 Capful by mouth in the morning and at bedtime.     clobetasol (TEMOVATE) 0.05 % external solution as needed.     colestipol (COLESTID) 1 g tablet TAKE 4 TABLETS (4 G TOTAL) BY MOUTH AT BEDTIME. 120 tablet 1   Digestive Enzymes (DIGESTIVE ENZYME PO) Take by mouth daily.     Diindolylmethane POWD 1 Capful by Does not apply route in the morning and at bedtime.     MAGNESIUM GLYCINATE PO Take 1 Capful by mouth in the morning and at bedtime.     Omega-3 Fatty Acids (OMEGA-3 FISH OIL PO) Take by mouth daily.     Selenium 200 MCG CAPS Take by mouth.     Specialty Vitamins Products (METHYL-GUARD PLUS PO) Take 1 Capful by mouth daily.     thyroid (ARMOUR) 15 MG tablet Take 60 mg by mouth daily.     Turmeric 500 MG TABS Take 750 mg by mouth daily.     Vitamin D-Vitamin K (VITAMIN K2-VITAMIN D3 PO) Take by mouth daily.     Zinc 50 MG TABS Take 1 tablet by mouth daily.     famotidine (PEPCID) 10 MG tablet Take 10 mg by mouth  as needed.     Milk Thistle 300 MG CAPS Take 600 mg by mouth daily.     Current Facility-Administered Medications  Medication Dose Route Frequency Provider Last Rate Last Admin   0.9 %  sodium chloride infusion  500 mL Intravenous Once Imogene Burn, MD        Allergies as of 08/09/2022 - Review Complete 08/09/2022  Allergen Reaction Noted   Codeine Nausea And Vomiting 10/10/2016   Escitalopram oxalate Other (See Comments) 10/10/2016   Zithromax [azithromycin] Other (See Comments) 03/11/2020   Colesevelam Rash 10/10/2016   Sulfa antibiotics Rash 05/04/2021    Family History  Problem Relation Age of Onset  Early death Mother    Stroke Mother    Early death Father    Heart disease Father    Colon cancer Maternal Aunt    Alcohol abuse Brother    Drug abuse Brother    Varicose Veins Maternal Grandmother     Social History   Socioeconomic History   Marital status: Married    Spouse name: Not on file   Number of children: Not on file   Years of education: Not on file   Highest education level: Not on file  Occupational History   Not on file  Tobacco Use   Smoking status: Never   Smokeless tobacco: Never  Vaping Use   Vaping Use: Never used  Substance and Sexual Activity   Alcohol use: Not Currently    Alcohol/week: 1.0 standard drink of alcohol    Types: 1 Glasses of wine per week   Drug use: Never   Sexual activity: Not Currently    Partners: Male    Birth control/protection: Post-menopausal  Other Topics Concern   Not on file  Social History Narrative   Not on file   Social Determinants of Health   Financial Resource Strain: Low Risk  (04/15/2020)   Overall Financial Resource Strain (CARDIA)    Difficulty of Paying Living Expenses: Not hard at all  Food Insecurity: No Food Insecurity (04/15/2020)   Hunger Vital Sign    Worried About Running Out of Food in the Last Year: Never true    Ran Out of Food in the Last Year: Never true  Transportation Needs: No  Transportation Needs (04/15/2020)   PRAPARE - Administrator, Civil Service (Medical): No    Lack of Transportation (Non-Medical): No  Physical Activity: Not on file  Stress: Not on file  Social Connections: Not on file  Intimate Partner Violence: Not on file    Physical Exam: Vital signs in last 24 hours: BP 115/70   Pulse 75   Temp (!) 97.1 F (36.2 C) (Temporal)   Ht 5\' 4"  (1.626 m)   Wt 128 lb (58.1 kg)   SpO2 99%   BMI 21.97 kg/m  GEN: NAD EYE: Sclerae anicteric ENT: MMM CV: Non-tachycardic Pulm: No increased WOB GI: Soft NEURO:  Alert & Oriented   Eulah Pont, MD [facility]   08/09/2022 8:13 AM

## 2022-08-09 NOTE — Progress Notes (Signed)
Vss nad trans to pacu 

## 2022-08-09 NOTE — Op Note (Signed)
[facility] Patient Name: Sheena White Procedure Date: 08/09/2022 8:14 AM MRN: [id number] Endoscopist: Particia Lather , , [id number] Age: 72 Referring MD:  Date of Birth: 1950/05/21 Gender: Female Account #: [id number] Procedure:                Colonoscopy Indications:              Diarrhea Medicines:                Monitored Anesthesia Care Procedure:                Pre-Anesthesia Assessment:                           - Prior to the procedure, a History and Physical                            was performed, and patient medications and                            allergies were reviewed. The patient's tolerance of                            previous anesthesia was also reviewed. The risks                            and benefits of the procedure and the sedation                            options and risks were discussed with the patient.                            All questions were answered, and informed consent                            was obtained. Prior Anticoagulants: The patient has                            taken no anticoagulant or antiplatelet agents. ASA                            Grade Assessment: II - A patient with mild systemic                            disease. After reviewing the risks and benefits,                            the patient was deemed in satisfactory condition to                            undergo the procedure.                           After obtaining informed consent, the colonoscope  was passed under direct vision. Throughout the                            procedure, the patient's blood pressure, pulse, and                            oxygen saturations were monitored continuously. The                            Olympus PCF-H190DL (#[id number]) Colonoscope was                            introduced through the anus and advanced to the the                            terminal ileum. The colonoscopy was performed                             without difficulty. The patient tolerated the                            procedure well. The quality of the bowel                            preparation was excellent. The terminal ileum,                            ileocecal valve, appendiceal orifice, and rectum                            were photographed. Scope In: 8:20:55 AM Scope Out: 8:36:33 AM Scope Withdrawal Time: 0 hours 12 minutes 20 seconds  Total Procedure Duration: 0 hours 15 minutes 38 seconds  Findings:                 The terminal ileum appeared normal.                           Non-bleeding internal hemorrhoids were found during                            retroflexion. The hemorrhoids were small.                           The exam was otherwise without abnormality.                           Biopsies for histology were taken with a cold                            forceps from the entire colon for evaluation of                            microscopic colitis. Complications:            No immediate complications. Estimated Blood Loss:  Estimated blood loss was minimal. Impression:               - The examined portion of the ileum was normal.                           - Non-bleeding internal hemorrhoids.                           - The examination was otherwise normal.                           - Biopsies were taken with a cold forceps from the                            entire colon for evaluation of microscopic colitis. Recommendation:           - Discharge patient to home (with escort).                           - Await pathology results.                           - The findings and recommendations were discussed                            with the patient.                           - Return to GI clinic in 3 months. Dr Particia Lather "Alan Ripper" Leonides Schanz,  08/09/2022 8:42:54 AM

## 2022-08-09 NOTE — Progress Notes (Signed)
Called to room to assist during endoscopic procedure.  Patient ID and intended procedure confirmed with present staff. Received instructions for my participation in the procedure from the performing physician.  

## 2022-08-09 NOTE — Progress Notes (Signed)
VS completed by DT.  Pt's states no medical or surgical changes since previsit or office visit.  

## 2022-08-09 NOTE — Patient Instructions (Signed)
Please see scheduled follow up appointment.   YOU HAD AN ENDOSCOPIC PROCEDURE TODAY AT THE [facility]:   Refer to the procedure report that was given to you for any specific questions about what was found during the examination.  If the procedure report does not answer your questions, please call your gastroenterologist to clarify.  If you requested that your care partner not be given the details of your procedure findings, then the procedure report has been included in a sealed envelope for you to review at your convenience later.  YOU SHOULD EXPECT: Some feelings of bloating in the abdomen. Passage of more gas than usual.  Walking can help get rid of the air that was put into your GI tract during the procedure and reduce the bloating. If you had a lower endoscopy (such as a colonoscopy or flexible sigmoidoscopy) you may notice spotting of blood in your stool or on the toilet paper. If you underwent a bowel prep for your procedure, you may not have a normal bowel movement for a few days.  Please Note:  You might notice some irritation and congestion in your nose or some drainage.  This is from the oxygen used during your procedure.  There is no need for concern and it should clear up in a day or so.  SYMPTOMS TO REPORT IMMEDIATELY:  Following lower endoscopy (colonoscopy or flexible sigmoidoscopy):  Excessive amounts of blood in the stool  Significant tenderness or worsening of abdominal pains  Swelling of the abdomen that is new, acute  Fever of 100F or higher  For urgent or emergent issues, a gastroenterologist can be reached at any hour by calling [phone]. Do not use MyChart messaging for urgent concerns.    DIET:  We do recommend a small meal at first, but then you may proceed to your regular diet.  Drink plenty of fluids but you should avoid alcoholic beverages for 24 hours.  ACTIVITY:  You should plan to take it easy for the rest of today and you should NOT DRIVE or  use heavy machinery until tomorrow (because of the sedation medicines used during the test).    FOLLOW UP: Our staff will call the number listed on your records the next business day following your procedure.  We will call around 7:15- 8:00 am to check on you and address any questions or concerns that you may have regarding the information given to you following your procedure. If we do not reach you, we will leave a message.     If any biopsies were taken you will be contacted by phone or by letter within the next 1-3 weeks.  Please call us at [phone] if you have not heard about the biopsies in 3 weeks.    SIGNATURES/CONFIDENTIALITY: You and/or your care partner have signed paperwork which will be entered into your electronic medical record.  These signatures attest to the fact that that the information above on your After Visit Summary has been reviewed and is understood.  Full responsibility of the confidentiality of this discharge information lies with you and/or your care-partner.

## 2022-08-10 ENCOUNTER — Telehealth: Payer: Self-pay | Admitting: *Deleted

## 2022-08-10 NOTE — Telephone Encounter (Signed)
  Follow up Call-     08/09/2022    7:15 AM  Call back number  Post procedure Call Back phone  # [phone]  Permission to leave phone message Yes     Patient questions:  Do you have a fever, pain , or abdominal swelling? No. Pain Score  0 *  Have you tolerated food without any problems? Yes.    Have you been able to return to your normal activities? Yes.    Do you have any questions about your discharge instructions: Diet   No. Medications  No. Follow up visit  No.  Do you have questions or concerns about your Care? No.  Actions: * If pain score is 4 or above: No action needed, pain <4.

## 2022-08-15 ENCOUNTER — Encounter: Payer: Self-pay | Admitting: Internal Medicine

## 2022-11-09 ENCOUNTER — Encounter: Payer: Self-pay | Admitting: Internal Medicine

## 2022-11-09 ENCOUNTER — Ambulatory Visit: Payer: Medicare PPO | Admitting: Internal Medicine

## 2022-11-09 VITALS — BP 90/58 | HR 76 | Ht 64.0 in | Wt 126.5 lb

## 2022-11-09 DIAGNOSIS — R1011 Right upper quadrant pain: Secondary | ICD-10-CM | POA: Diagnosis not present

## 2022-11-09 DIAGNOSIS — R197 Diarrhea, unspecified: Secondary | ICD-10-CM | POA: Diagnosis not present

## 2022-11-09 MED ORDER — COLESTIPOL HCL 1 G PO TABS: 2.0000 g | ORAL_TABLET | Freq: Every evening | ORAL | 5 refills | Status: DC

## 2022-11-09 NOTE — Progress Notes (Signed)
Chief Complaint: Diarrhea  HPI : 72 year old female with history of lobular breast cancer s/p surgery/radiation, hypothyroidism, arthritis, and psoriasis presents for follow up of bile acid diarrhea  Interval History: She has been happy with switching from cholestyramine to colestipol. The colestipol is easier to take. Currently she is taking 2 mg before bedtime on an empty stomach. She has joined a Facebook group for people with bile acid malabsorption. Stools have been close to normal, just a little bit more sticky. She has on average 1-2 BMs per day. She can eat anything that she wants now. She does still have some pain in the RUQ on occasion, which can worsens after she twists while doing yoga. The RUQ ab pain is not that bothersome. She does not wish to get her gallbladder removed.   Wt Readings from Last 3 Encounters:  11/09/22 126 lb 8 oz (57.4 kg)  08/09/22 128 lb (58.1 kg)  07/11/22 128 lb (58.1 kg)   Past Medical History:  Diagnosis Date   Anxiety    Arthritis    mild   Complication of anesthesia    very easily sedate   GERD (gastroesophageal reflux disease)    History of COVID-19 06/2020   per pt mild to moderate that resolved   History of external beam radiation therapy    completed 09-09-2020  for left breast cancer   History of skin cancer 2000   Hypothyroidism (acquired)    followed by pcp;   per pt had issue since radiation treatment for left breast cancer 02/ 2022   Malignant neoplasm of upper-outer quadrant of left breast in female, estrogen receptor positive (HCC) 04/2020   followed by [facility] , Dr Kathie Rhodes. Dent; dx 01/ 2022  invasive lobular carcinoma;  06-03-2020  s/p left breast lumpectomy w/ node dissection ;  completed radiation 09-09-2020   PONV (postoperative nausea and vomiting)    Psoriasis    Raynaud's disease    Stenosis of cervix    Thickened endometrium      Past Surgical History:  Procedure Laterality Date   BREAST LUMPECTOMY WITH  RADIOACTIVE SEED AND SENTINEL LYMPH NODE BIOPSY Left 06/03/2020   Procedure: LEFT BREAST LUMPECTOMY WITH RADIOACTIVE SEED AND SENTINEL LYMPH NODE BIOPSY;  Surgeon: Griselda Miner, MD;  Location: [facility];  Service: General;  Laterality: Left;   CATARACT EXTRACTION W/ INTRAOCULAR LENS IMPLANT Bilateral 2019   COLONOSCOPY  12/2005   DILATATION & CURETTAGE/HYSTEROSCOPY WITH MYOSURE N/A 05/10/2021   Procedure: DILATATION & CURETTAGE/HYSTEROSCOPY WITH MYOSURE;  Surgeon: Romualdo Bolk, MD;  Location: [facility];  Service: Gynecology;  Laterality: N/A;   OPERATIVE ULTRASOUND N/A 05/10/2021   Procedure: OPERATIVE ULTRASOUND;  Surgeon: Romualdo Bolk, MD;  Location: [facility];  Service: Gynecology;  Laterality: N/A;   SHOULDER ARTHROSCOPY Left    yrs ago   TONSILLECTOMY     child   Family History  Problem Relation Age of Onset   Early death Mother    Stroke Mother    Early death Father    Heart disease Father    Colon cancer Maternal Aunt    Alcohol abuse Brother    Drug abuse Brother    Varicose Veins Maternal Grandmother    Social History   Tobacco Use   Smoking status: Never   Smokeless tobacco: Never  Vaping Use   Vaping status: Never Used  Substance Use Topics   Alcohol use: Not Currently  Alcohol/week: 1.0 standard drink of alcohol    Types: 1 Glasses of wine per week   Drug use: Never   Current Outpatient Medications  Medication Sig Dispense Refill   ARMOUR THYROID 60 MG tablet Take 60 mg by mouth daily.     Ascorbic Acid (VITAMIN C PO) Take by mouth daily.     calcipotriene-betamethasone (TACLONEX SCALP) external suspension as needed.     calcium gluconate 500 MG tablet Take 1 tablet by mouth daily.     CALCIUM PO Take 1 Capful by mouth in the morning and at bedtime.     clobetasol (TEMOVATE) 0.05 % external solution as needed.     colestipol (COLESTID) 1 g tablet TAKE 4 TABLETS (4 G TOTAL) BY MOUTH AT  BEDTIME. (Patient taking differently: Take 2 g by mouth at bedtime.) 120 tablet 1   Digestive Enzymes (DIGESTIVE ENZYME PO) Take by mouth daily.     Diindolylmethane POWD 1 Capful by Does not apply route in the morning and at bedtime.     Ergocalciferol 10 MCG (400 UNIT) TABS Take 1 capsule by mouth daily.     famotidine (PEPCID) 10 MG tablet Take 10 mg by mouth as needed.     MAGNESIUM GLYCINATE PO Take 1 Capful by mouth in the morning and at bedtime.     Milk Thistle 300 MG CAPS Take 600 mg by mouth daily.     Omega-3 Fatty Acids (OMEGA-3 FISH OIL PO) Take by mouth daily.     Selenium 200 MCG CAPS Take by mouth.     Specialty Vitamins Products (METHYL-GUARD PLUS PO) Take 1 Capful by mouth daily.     Turmeric 500 MG TABS Take 750 mg by mouth daily.     Vitamin D-Vitamin K (VITAMIN K2-VITAMIN D3 PO) Take by mouth daily.     Zinc 50 MG TABS Take 1 tablet by mouth daily.     No current facility-administered medications for this visit.   Allergies  Allergen Reactions   Codeine Nausea And Vomiting   Escitalopram Oxalate Other (See Comments)    "sensitivity"   Statins     Other Reaction(s): Cough  Anxious, insomnia,  muscle aches   Zithromax [Azithromycin] Other (See Comments)    Never wants to take again , "made me sick"   Colesevelam Rash   Sulfa Antibiotics Rash    Physical Exam: BP (!) 90/58 (BP Location: Left Arm, Patient Position: Sitting, Cuff Size: Normal)   Pulse 76   Ht 5\' 4"  (1.626 m)   Wt 126 lb 8 oz (57.4 kg)   BMI 21.71 kg/m  Constitutional: Pleasant,well-developed, female in no acute distress. HEENT: Normocephalic and atraumatic. Conjunctivae are normal. No scleral icterus. Cardiovascular: Normal rate, regular rhythm.  Pulmonary/chest: Effort normal and breath sounds normal. No wheezing, rales or rhonchi. Abdominal: Soft, nondistended, nontender. Bowel sounds active throughout.  Extremities: No edema Neurological: Alert and oriented to person place and  time. Skin: Skin is warm and dry. No rashes noted. Psychiatric: Normal mood and affect. Behavior is normal.  Labs 03/2022: GI path panel and C dif negative. CRP nml. ESR mildly elevated at 37.  CT A/P w/contrast 03/10/21 Impression:  1. No definite evidence for metastatic disease.  2.  Nonspecific thickening of the endometrial complex, may be related to  tamoxifen related treatment change. Recommend pelvic ultrasound and  Gynecology consultation.  Colonoscopy 12/30/05: Excellent prep. Normal terminal ileum. Normal colon.   Colonoscopy 01/07/16: Colon nml. Terminal ileum nml. Excellent quality prep.  Colonoscopy 08/09/22:  Path: Surgical [P], colon nos, random sites - BENIGN COLONIC MUCOSA WITH MILD NONSPECIFIC ACUTE COLITIS - NEGATIVE FOR FEATURES OF CHRONICITY - NEGATIVE FOR CARCINOMA  ASSESSMENT AND PLAN: Bile acid diarrhea  RUQ ab pain History of lobular breast cancer Patient presents for follow up of bile acid diarrhea. Her recent colonoscopy did not show any evidence of microscopic colitis. She has been experiencing some pain in her RUQ, which mainly occurs when she twists while doing yoga. She has attributed this to her gallbladder in the past. Because the pain is not that bothersome, she is not interested in pursuing abdominal imaging for further evaluation of the pain at this time. If she has worsening of the pain in the future, she will reach out and let me know. - Continue daily psyllium - Cont colestipol 2 mg at bedtime. Refilled. - Declined imaging for RUQ ab pain at this time - RTC PRN  Eulah Pont, MD  I spent 30 minutes of time, including in depth chart review, independent review of results as outlined above, communicating results with the patient directly, face-to-face time with the patient, coordinating care, ordering studies and medications as appropriate, and documentation.

## 2022-12-15 DIAGNOSIS — C50812 Malignant neoplasm of overlapping sites of left female breast: Secondary | ICD-10-CM | POA: Diagnosis not present

## 2022-12-15 DIAGNOSIS — Z170 Estrogen receptor positive status [ER+]: Secondary | ICD-10-CM | POA: Diagnosis not present

## 2022-12-15 DIAGNOSIS — C50912 Malignant neoplasm of unspecified site of left female breast: Secondary | ICD-10-CM | POA: Diagnosis not present

## 2024-01-03 ENCOUNTER — Ambulatory Visit: Admitting: Gastroenterology

## 2024-01-03 ENCOUNTER — Encounter: Payer: Self-pay | Admitting: Gastroenterology

## 2024-01-03 VITALS — BP 124/82 | HR 60 | Ht 64.0 in | Wt 128.8 lb

## 2024-01-03 DIAGNOSIS — R1011 Right upper quadrant pain: Secondary | ICD-10-CM

## 2024-01-03 DIAGNOSIS — K9089 Other intestinal malabsorption: Secondary | ICD-10-CM

## 2024-01-03 MED ORDER — COLESTIPOL HCL 1 G PO TABS
2.0000 g | ORAL_TABLET | Freq: Every evening | ORAL | 5 refills | Status: AC
Start: 2024-01-03 — End: ?

## 2024-01-03 NOTE — Progress Notes (Signed)
 Chief Complaint: RUQ pain Primary GI Doctor: Dr. Federico  HPI: 73 year old female with history of lobular breast cancer s/p surgery/radiation, hypothyroidism, arthritis, and psoriasis presents for RUQ pain.    Patient last seen in GI office by Dr. Federico on 11/09/22 for follow-up of bile salt diarrhea. She has been happy with switching from cholestyramine to colestipol . Currently she is taking 2 mg before bedtime on an empty stomach.   Interval History   Patient presents for evaluation of RUQ pain and has concerns it is related to her gallbladder. She reports it as increased in frequency and severity. She notes the pain will intensify with eating fatty foods. She avoids most fatty foods but occasionally has something like chocolate. She notes it can increase with movement or twisting that feels like a sharp knife stabbing her. No nausea or vomiting. No fever or chills    She is still taking colestipol  1 tablets po daily for bile salt diarrhea. She was started to taking Naltrexone 3 mg po daily by PCP for the diarrhea therefore she decreased the colestipol  from 2 to 1 tablets po daily. She tried discontinuing and symptoms not controlled.    Wt Readings from Last 3 Encounters:  01/03/24 128 lb 12.8 oz (58.4 kg)  11/09/22 126 lb 8 oz (57.4 kg)  08/09/22 128 lb (58.1 kg)     Past Medical History:  Diagnosis Date   Anxiety    Arthritis    mild   Complication of anesthesia    very easily sedate   GERD (gastroesophageal reflux disease)    History of COVID-19 06/2020   per pt mild to moderate that resolved   History of external beam radiation therapy    completed 09-09-2020  for left breast cancer   History of skin cancer 2000   Hypothyroidism (acquired)    followed by pcp;   per pt had issue since radiation treatment for left breast cancer 02/ 2022   Malignant neoplasm of upper-outer quadrant of left breast in female, estrogen receptor positive (HCC) 04/2020   followed by [facility] , Dr GORMAN. Dent; dx 01/ 2022  invasive lobular carcinoma;  06-03-2020  s/p left breast lumpectomy w/ node dissection ;  completed radiation 09-09-2020   PONV (postoperative nausea and vomiting)    Psoriasis    Raynaud's disease    Stenosis of cervix    Thickened endometrium     Past Surgical History:  Procedure Laterality Date   BREAST LUMPECTOMY WITH RADIOACTIVE SEED AND SENTINEL LYMPH NODE BIOPSY Left 06/03/2020   Procedure: LEFT BREAST LUMPECTOMY WITH RADIOACTIVE SEED AND SENTINEL LYMPH NODE BIOPSY;  Surgeon: Curvin Deward MOULD, MD;  Location:  SUR[facility]ERY C[facility];  Service: General;  Laterality: Left;   CATARACT EXTRACTION W/ INTRAOCULAR LENS IMPLANT Bilateral 2019   COLONOSCOPY  12/2005   DILATATION & CURETTAGE/HYSTEROSCOPY WITH MYOSURE N/A 05/10/2021   Procedure: DILATATION & CURETTAGE/HYSTEROSCOPY WITH MYOSURE;  Surgeon: Jannis Kate Norris, MD;  Location: [facility] ;  Service: Gynecology;  Laterality: N/A;   OPERATIVE ULTRASOUND N/A 05/10/2021   Procedure: OPERATIVE ULTRASOUND;  Surgeon: Jannis Kate Norris, MD;  Location: [facility];  Service: Gynecology;  Laterality: N/A;   SHOULDER ARTHROSCOPY Left    yrs ago   TONSILLECTOMY     child    Current Outpatient Medications  Medication Sig Dispense Refill   ARMOUR THYROID  60 MG tablet Take 60 mg by mouth daily. (Patient taking differently: Take 90 mg by mouth  daily.)     Ascorbic Acid (VITAMIN C PO) Take by mouth daily.     calcipotriene-betamethasone (TACLONEX SCALP) external suspension as needed.     calcium gluconate 500 MG tablet Take 1 tablet by mouth daily.     CALCIUM PO Take 1 Capful by mouth in the morning and at bedtime.     clobetasol (TEMOVATE) 0.05 % external solution as needed.     colestipol  (COLESTID ) 1 g tablet Take 2 tablets (2 g total) by mouth at bedtime. 120 tablet 5   Digestive Enzymes (DIGESTIVE ENZYME PO) Take by mouth daily.     Diindolylmethane POWD 1 Capful by  Does not apply route in the morning and at bedtime.     Ergocalciferol 10 MCG (400 UNIT) TABS Take 1 capsule by mouth daily.     famotidine (PEPCID) 10 MG tablet Take 10 mg by mouth as needed.     MAGNESIUM GLYCINATE PO Take 1 Capful by mouth in the morning and at bedtime.     Milk Thistle 300 MG CAPS Take 600 mg by mouth daily.     NALTREXONE HCL, PAIN, PO Take 3 mg by mouth at bedtime.     Omega-3 Fatty Acids (OMEGA-3 FISH OIL PO) Take by mouth daily.     Selenium 200 MCG CAPS Take by mouth.     Specialty Vitamins Products (METHYL-GUARD PLUS PO) Take 1 Capful by mouth daily.     Turmeric 500 MG TABS Take 750 mg by mouth daily.     Vitamin D-Vitamin K (VITAMIN K2-VITAMIN D3 PO) Take by mouth daily.     Zinc 50 MG TABS Take 1 tablet by mouth daily.     No current facility-administered medications for this visit.    Allergies as of 01/03/2024 - Review Complete 01/03/2024  Allergen Reaction Noted   Codeine Nausea And Vomiting 10/10/2016   Escitalopram oxalate Other (See Comments) 10/10/2016   Statins  10/10/2016   Zithromax [azithromycin] Other (See Comments) 03/11/2020   Colesevelam Rash 10/10/2016   Sulfa antibiotics Rash 05/04/2021    Family History  Problem Relation Age of Onset   Early death Mother    Stroke Mother    Early death Father    Heart disease Father    Colon cancer Maternal Aunt    Alcohol abuse Brother    Drug abuse Brother    Varicose Veins Maternal Grandmother     Review of Systems:    Constitutional: No weight loss, fever, chills, weakness or fatigue HEENT: Eyes: No change in vision               Ears, Nose, Throat:  No change in hearing or congestion Skin: No rash or itching Cardiovascular: No chest pain, chest pressure or palpitations   Respiratory: No SOB or cough Gastrointestinal: See HPI and otherwise negative Genitourinary: No dysuria or change in urinary frequency Neurological: No headache, dizziness or syncope Musculoskeletal: No new muscle or  joint pain Hematologic: No bleeding or bruising Psychiatric: No history of depression or anxiety    Physical Exam:  Vital signs: BP 124/82   Pulse 60   Ht 5' 4 (1.626 m)   Wt 128 lb 12.8 oz (58.4 kg)   BMI 22.11 kg/m   Constitutional:   Pleasant female appears to be in NAD, Well developed, Well nourished, alert and cooperative Throat: Oral cavity and pharynx without inflammation, swelling or lesion.  Respiratory: Respirations even and unlabored. Lungs clear to auscultation bilaterally.   No wheezes, crackles,  or rhonchi.  Cardiovascular: Normal S1, S2. Regular rate and rhythm. No peripheral edema, cyanosis or pallor.  Gastrointestinal:  Soft, nondistended, RUQ abd tenderness. No rebound or guarding. Normal bowel sounds. No appreciable masses or hepatomegaly. Rectal:  Not performed.  Msk:  Symmetrical without gross deformities. Without edema, no deformity or joint abnormality.  Neurologic:  Alert and  oriented x4;  grossly normal neurologically.  Skin:   Dry and intact without significant lesions or rashes.  RELEVANT LABS AND IMAGING: CBC    Latest Ref Rng & Units 09/23/2020    8:44 AM 07/22/2020    4:15 PM 04/15/2020    8:23 AM  CBC  WBC 3.8 - 10.8 Thousand/uL 5.4  9.7  6.1   Hemoglobin 11.7 - 15.5 g/dL 87.6  86.8  87.1   Hematocrit 35.0 - 45.0 % 39.1  40.8  38.6   Platelets 140 - 400 Thousand/uL 271  305  236      CMP     Latest Ref Rng & Units 09/23/2020    8:44 AM 07/22/2020    4:15 PM 04/15/2020    8:23 AM  CMP  Glucose 65 - 99 mg/dL 87  883  80   BUN 7 - 25 mg/dL 18  13  14    Creatinine 0.50 - 0.99 mg/dL 9.22  9.25  9.07   Sodium 135 - 146 mmol/L 133  140  137   Potassium 3.5 - 5.3 mmol/L 3.7  3.6  3.3   Chloride 98 - 110 mmol/L 97  101  100   CO2 20 - 32 mmol/L 25  29  28    Calcium 8.6 - 10.4 mg/dL 9.4  9.7  9.5   Total Protein 6.1 - 8.1 g/dL 6.5  7.0  7.6   Total Bilirubin 0.2 - 1.2 mg/dL 0.5  0.3  0.3   Alkaline Phos 38 - 126 U/L   80   AST 10 - 35 U/L 22   37  28   ALT 6 - 29 U/L 17  41  23      Lab Results  Component Value Date   TSH 0.02 (L) 09/23/2020  Labs 03/2022: GI path panel and C dif negative. CRP nml. ESR mildly elevated at 37.   Imaging:  CT A/P w/contrast 03/10/21 Impression:  1. No definite evidence for metastatic disease.  2.  Nonspecific thickening of the endometrial complex, Railee Bonillas be related to  tamoxifen related treatment change. Recommend pelvic ultrasound and  Gynecology consultation.    Procedures: Colonoscopy 12/30/05: Excellent prep. Normal terminal ileum. Normal colon.    Colonoscopy 01/07/16: Colon nml. Terminal ileum nml. Excellent quality prep.    Colonoscopy 08/09/22: Recall 10 years  Path: Surgical [P], colon nos, random sites - BENIGN COLONIC MUCOSA WITH MILD NONSPECIFIC ACUTE COLITIS - NEGATIVE FOR FEATURES OF CHRONICITY - NEGATIVE FOR CARCINOMA  Assessment: Encounter Diagnoses  Name Primary?   RUQ pain Yes   Bile salt-induced diarrhea    73 year old female patient who presents with worsening right upper quadrant pain.  Patient reports the pain is worse with eating as well as movement.  She would like to pursue imaging to rule out gallbladder issues.     For the bile acid diarrhea she has tolerated the colestipol  well we will continue current prescription.    Patient up-to-date on colon screening colonoscopy next due April 2034   Plan: - continue colestipol  1-2 tablets po daily, refilled - order RUQ abdominal U/S -recall colonoscopy 07/2032  Thank you for the courtesy of this consult. Please call me with any questions or concerns.   Keisi Eckford, FNP-C [facility] 01/03/2024, 10:32 AM  Cc: Hughie Sharper, MD

## 2024-01-03 NOTE — Patient Instructions (Signed)
 You have been scheduled for an abdominal ultrasound at [facility] (1st floor of hospital) on 01/10/24 at 11:00am. Please arrive 30 minutes prior to your appointment for registration. Make certain not to have anything to eat or drink 6 hours prior to your appointment. Should you need to reschedule your appointment, please contact radiology at [phone]. This test typically takes about 30 minutes to perform.  _______________________________________________________  If your blood pressure at your visit was 140/90 or greater, please contact your primary care physician to follow up on this.  _______________________________________________________  If you are age 79 or older, your body mass index should be between 23-30. Your Body mass index is 22.11 kg/m. If this is out of the aforementioned range listed, please consider follow up with your Primary Care Provider.  If you are age 58 or younger, your body mass index should be between 19-25. Your Body mass index is 22.11 kg/m. If this is out of the aformentioned range listed, please consider follow up with your Primary Care Provider.   ________________________________________________________  The [facility] providers would like to encourage you to use MYCHART to communicate with providers for non-urgent requests or questions.  Due to long hold times on the telephone, sending your provider a message by [facility] may be a faster and more efficient way to get a response.  Please allow 48 business hours for a response.  Please remember that this is for non-urgent requests.  _______________________________________________________  [facility] is using a team-based approach to care.  Your team is made up of your doctor and two to three APPS. Our APPS (Nurse Practitioners and Physician Assistants) work with your physician to ensure care continuity for you. They are fully qualified to address your health concerns and develop a treatment  plan. They communicate directly with your gastroenterologist to care for you. Seeing the Advanced Practice Practitioners on your physician's team can help you by facilitating care more promptly, often allowing for earlier appointments, access to diagnostic testing, procedures, and other specialty referrals.   Thank you for trusting me with your gastrointestinal care. Deanna May, FNP-C

## 2024-01-03 NOTE — Progress Notes (Signed)
 I agree with the assessment and plan as outlined by Ms. May.

## 2024-01-10 ENCOUNTER — Ambulatory Visit (HOSPITAL_COMMUNITY)

## 2024-01-11 ENCOUNTER — Ambulatory Visit (HOSPITAL_COMMUNITY)
Admission: RE | Admit: 2024-01-11 | Discharge: 2024-01-11 | Disposition: A | Discharge status: Home / Self Care | Source: Ambulatory Visit | Attending: Gastroenterology | Admitting: Gastroenterology

## 2024-01-11 DIAGNOSIS — R1011 Right upper quadrant pain: Secondary | ICD-10-CM | POA: Insufficient documentation

## 2024-01-11 DIAGNOSIS — K9089 Other intestinal malabsorption: Secondary | ICD-10-CM | POA: Insufficient documentation

## 2024-01-18 ENCOUNTER — Ambulatory Visit: Payer: Self-pay | Admitting: Gastroenterology
